# Patient Record
Sex: Female | Born: 1980
Health system: Southern US, Community
[De-identification: ages and names within clinical notes are randomized; demographics above are authoritative.]

## PROBLEM LIST (undated history)

## (undated) DIAGNOSIS — F419 Anxiety disorder, unspecified: Secondary | ICD-10-CM

## (undated) HISTORY — PX: WISDOM TOOTH EXTRACTION: SHX21

## (undated) HISTORY — DX: Anxiety disorder, unspecified: F41.9

## (undated) HISTORY — PX: TONSILLECTOMY: SUR1361

---
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
## 2002-07-29 ENCOUNTER — Encounter: Payer: Self-pay | Admitting: Family Medicine

## 2002-07-29 ENCOUNTER — Encounter: Admission: RE | Admit: 2002-07-29 | Discharge: 2002-07-29 | Payer: Self-pay | Admitting: Family Medicine

## 2002-08-13 ENCOUNTER — Other Ambulatory Visit: Admission: RE | Admit: 2002-08-13 | Discharge: 2002-08-13 | Payer: Self-pay | Admitting: Obstetrics and Gynecology

## 2003-06-22 ENCOUNTER — Other Ambulatory Visit: Admission: RE | Admit: 2003-06-22 | Discharge: 2003-06-22 | Payer: Self-pay | Admitting: Obstetrics and Gynecology

## 2004-05-12 ENCOUNTER — Inpatient Hospital Stay: Payer: Self-pay | Admitting: Obstetrics & Gynecology

## 2008-05-18 ENCOUNTER — Emergency Department (HOSPITAL_COMMUNITY): Admission: EM | Admit: 2008-05-18 | Discharge: 2008-05-18 | Payer: Self-pay | Admitting: Emergency Medicine

## 2008-10-19 ENCOUNTER — Ambulatory Visit (HOSPITAL_COMMUNITY): Admission: RE | Admit: 2008-10-19 | Discharge: 2008-10-19 | Payer: Self-pay | Admitting: Obstetrics & Gynecology

## 2009-03-22 ENCOUNTER — Emergency Department: Payer: Self-pay | Admitting: Emergency Medicine

## 2009-03-27 ENCOUNTER — Inpatient Hospital Stay (HOSPITAL_COMMUNITY): Admission: RE | Admit: 2009-03-27 | Discharge: 2009-03-29 | Payer: Self-pay | Admitting: Obstetrics & Gynecology

## 2010-09-27 LAB — CBC
HCT: 35.3 % — ABNORMAL LOW (ref 36.0–46.0)
Hemoglobin: 10.7 g/dL — ABNORMAL LOW (ref 12.0–15.0)
Hemoglobin: 11.9 g/dL — ABNORMAL LOW (ref 12.0–15.0)
MCHC: 33.5 g/dL (ref 30.0–36.0)
MCHC: 33.6 g/dL (ref 30.0–36.0)
MCV: 90.7 fL (ref 78.0–100.0)
MCV: 92.3 fL (ref 78.0–100.0)
Platelets: 272 10*3/uL (ref 150–400)
RBC: 3.45 MIL/uL — ABNORMAL LOW (ref 3.87–5.11)
RBC: 3.89 MIL/uL (ref 3.87–5.11)
RDW: 13.7 % (ref 11.5–15.5)
RDW: 13.9 % (ref 11.5–15.5)
WBC: 10.3 10*3/uL (ref 4.0–10.5)

## 2011-03-26 LAB — HCG, QUANTITATIVE, PREGNANCY: hCG, Beta Chain, Quant, S: 4521 — ABNORMAL HIGH

## 2011-03-26 LAB — URINALYSIS, ROUTINE W REFLEX MICROSCOPIC
Bilirubin Urine: NEGATIVE
Glucose, UA: NEGATIVE
Ketones, ur: NEGATIVE
Protein, ur: 30 — AB
Urobilinogen, UA: 1

## 2011-03-26 LAB — URINE MICROSCOPIC-ADD ON

## 2011-03-26 LAB — ABO/RH: ABO/RH(D): O POS

## 2011-03-26 LAB — POCT PREGNANCY, URINE: Preg Test, Ur: POSITIVE

## 2012-11-11 ENCOUNTER — Ambulatory Visit: Payer: Medicaid Other | Admitting: Obstetrics & Gynecology

## 2013-01-06 ENCOUNTER — Ambulatory Visit: Payer: Medicaid Other | Admitting: Obstetrics & Gynecology

## 2013-02-03 ENCOUNTER — Encounter: Payer: Self-pay | Admitting: Obstetrics & Gynecology

## 2013-02-03 ENCOUNTER — Ambulatory Visit (INDEPENDENT_AMBULATORY_CARE_PROVIDER_SITE_OTHER): Payer: Medicaid Other | Admitting: Obstetrics & Gynecology

## 2013-02-03 VITALS — BP 108/66 | HR 89 | Temp 98.8°F | Ht 67.0 in | Wt 156.0 lb

## 2013-02-03 DIAGNOSIS — B379 Candidiasis, unspecified: Secondary | ICD-10-CM

## 2013-02-03 DIAGNOSIS — Z0000 Encounter for general adult medical examination without abnormal findings: Secondary | ICD-10-CM

## 2013-02-03 DIAGNOSIS — B373 Candidiasis of vulva and vagina: Secondary | ICD-10-CM

## 2013-02-03 NOTE — Progress Notes (Signed)
Subjective:     Katie Carson is a 32 y.o. female here for a routine exam.  Current complaints: patient is in the office for annual exam- possible yeast infection.     Gynecologic History No LMP recorded. Patient is not currently having periods (Reason: IUD). Contraception: IUD Last Pap: 1 year. Results were: normal Last mammogram: never. Breast US for breast cyst  Obstetric History OB History  No data available     The following portions of the patient's history were reviewed and updated as appropriate: allergies, current medications, past family history, past medical history, past social history, past surgical history and problem list.  Review of Systems Pertinent items are noted in HPI.    Objective:    General appearance: alert Breasts: normal appearance, no masses or tenderness Abdomen: soft, non-tender; bowel sounds normal; no masses,  no organomegaly Pelvic: cervix normal in appearance, IUD strings present, external genitalia normal, no adnexal masses or tenderness, uterus normal size, shape, and consistency and vagina normal with white discharge    Assessment:   Normal exam   Plan:  Check wet prep by molecular probe Return in 1 yr

## 2013-02-04 LAB — WET PREP BY MOLECULAR PROBE
Candida species: NEGATIVE
Gardnerella vaginalis: POSITIVE — AB
Trichomonas vaginosis: NEGATIVE

## 2013-02-05 LAB — POCT WET PREP (WET MOUNT): Clue Cells Wet Prep Whiff POC: NEGATIVE

## 2013-02-05 NOTE — Patient Instructions (Signed)
Health Maintenance, Females A healthy lifestyle and preventative care can promote health and wellness.  Maintain regular health, dental, and eye exams.  Eat a healthy diet. Foods like vegetables, fruits, whole grains, low-fat dairy products, and lean protein foods contain the nutrients you need without too many calories. Decrease your intake of foods high in solid fats, added sugars, and salt. Get information about a proper diet from your caregiver, if necessary.  Regular physical exercise is one of the most important things you can do for your health. Most adults should get at least 150 minutes of moderate-intensity exercise (any activity that increases your heart rate and causes you to sweat) each week. In addition, most adults need muscle-strengthening exercises on 2 or more days a week.   Maintain a healthy weight. The body mass index (BMI) is a screening tool to identify possible weight problems. It provides an estimate of body fat based on height and weight. Your caregiver can help determine your BMI, and can help you achieve or maintain a healthy weight. For adults 20 years and older:  A BMI below 18.5 is considered underweight.  A BMI of 18.5 to 24.9 is normal.  A BMI of 25 to 29.9 is considered overweight.  A BMI of 30 and above is considered obese.  Maintain normal blood lipids and cholesterol by exercising and minimizing your intake of saturated fat. Eat a balanced diet with plenty of fruits and vegetables. Blood tests for lipids and cholesterol should begin at age 20 and be repeated every 5 years. If your lipid or cholesterol levels are high, you are over 50, or you are a high risk for heart disease, you may need your cholesterol levels checked more frequently.Ongoing high lipid and cholesterol levels should be treated with medicines if diet and exercise are not effective.  If you smoke, find out from your caregiver how to quit. If you do not use tobacco, do not start.  If you  are pregnant, do not drink alcohol. If you are breastfeeding, be very cautious about drinking alcohol. If you are not pregnant and choose to drink alcohol, do not exceed 1 drink per day. One drink is considered to be 12 ounces (355 mL) of beer, 5 ounces (148 mL) of wine, or 1.5 ounces (44 mL) of liquor.  Avoid use of street drugs. Do not share needles with anyone. Ask for help if you need support or instructions about stopping the use of drugs.  High blood pressure causes heart disease and increases the risk of stroke. Blood pressure should be checked at least every 1 to 2 years. Ongoing high blood pressure should be treated with medicines, if weight loss and exercise are not effective.  If you are 55 to 30 years old, ask your caregiver if you should take aspirin to prevent strokes.  Diabetes screening involves taking a blood sample to check your fasting blood sugar level. This should be done once every 3 years, after age 45, if you are within normal weight and without risk factors for diabetes. Testing should be considered at a younger age or be carried out more frequently if you are overweight and have at least 1 risk factor for diabetes.  Breast cancer screening is essential preventative care for women. You should practice "breast self-awareness." This means understanding the normal appearance and feel of your breasts and may include breast self-examination. Any changes detected, no matter how small, should be reported to a caregiver. Women in their 20s and 30s should have   a clinical breast exam (CBE) by a caregiver as part of a regular health exam every 1 to 3 years. After age 40, women should have a CBE every year. Starting at age 40, women should consider having a mammogram (breast X-ray) every year. Women who have a family history of breast cancer should talk to their caregiver about genetic screening. Women at a high risk of breast cancer should talk to their caregiver about having an MRI and a  mammogram every year.  The Pap test is a screening test for cervical cancer. Women should have a Pap test starting at age 21. Between ages 21 and 29, Pap tests should be repeated every 2 years. Beginning at age 30, you should have a Pap test every 3 years as long as the past 3 Pap tests have been normal. If you had a hysterectomy for a problem that was not cancer or a condition that could lead to cancer, then you no longer need Pap tests. If you are between ages 65 and 70, and you have had normal Pap tests going back 10 years, you no longer need Pap tests. If you have had past treatment for cervical cancer or a condition that could lead to cancer, you need Pap tests and screening for cancer for at least 20 years after your treatment. If Pap tests have been discontinued, risk factors (such as a new sexual partner) need to be reassessed to determine if screening should be resumed. Some women have medical problems that increase the chance of getting cervical cancer. In these cases, your caregiver may recommend more frequent screening and Pap tests.  The human papillomavirus (HPV) test is an additional test that may be used for cervical cancer screening. The HPV test looks for the virus that can cause the cell changes on the cervix. The cells collected during the Pap test can be tested for HPV. The HPV test could be used to screen women aged 30 years and older, and should be used in women of any age who have unclear Pap test results. After the age of 30, women should have HPV testing at the same frequency as a Pap test.  Colorectal cancer can be detected and often prevented. Most routine colorectal cancer screening begins at the age of 50 and continues through age 75. However, your caregiver may recommend screening at an earlier age if you have risk factors for colon cancer. On a yearly basis, your caregiver may provide home test kits to check for hidden blood in the stool. Use of a small camera at the end of a  tube, to directly examine the colon (sigmoidoscopy or colonoscopy), can detect the earliest forms of colorectal cancer. Talk to your caregiver about this at age 50, when routine screening begins. Direct examination of the colon should be repeated every 5 to 10 years through age 75, unless early forms of pre-cancerous polyps or small growths are found.  Hepatitis C blood testing is recommended for all people born from 1945 through 1965 and any individual with known risks for hepatitis C.  Practice safe sex. Use condoms and avoid high-risk sexual practices to reduce the spread of sexually transmitted infections (STIs). Sexually active women aged 25 and younger should be checked for Chlamydia, which is a common sexually transmitted infection. Older women with new or multiple partners should also be tested for Chlamydia. Testing for other STIs is recommended if you are sexually active and at increased risk.  Osteoporosis is a disease in which the   bones lose minerals and strength with aging. This can result in serious bone fractures. The risk of osteoporosis can be identified using a bone density scan. Women ages 12 and over and women at risk for fractures or osteoporosis should discuss screening with their caregivers. Ask your caregiver whether you should be taking a calcium supplement or vitamin D to reduce the rate of osteoporosis.  Menopause can be associated with physical symptoms and risks. Hormone replacement therapy is available to decrease symptoms and risks. You should talk to your caregiver about whether hormone replacement therapy is right for you.  Use sunscreen with a sun protection factor (SPF) of 30 or greater. Apply sunscreen liberally and repeatedly throughout the day. You should seek shade when your shadow is shorter than you. Protect yourself by wearing long sleeves, pants, a wide-brimmed hat, and sunglasses year round, whenever you are outdoors.  Notify your caregiver of new moles or  changes in moles, especially if there is a change in shape or color. Also notify your caregiver if a mole is larger than the size of a pencil eraser.  Stay current with your immunizations. Document Released: 12/24/2010 Document Revised: 09/02/2011 Document Reviewed: 12/24/2010 [facility] Patient Information 2014 [place], [place]. Vaginitis Vaginitis is an inflammation of the vagina. It is most often caused by a change in the normal balance of the bacteria and yeast that live in the vagina. This change in balance causes an overgrowth of certain bacteria or yeast, which causes the inflammation. There are different types of vaginitis, but the most common types are:  Bacterial vaginosis.  Yeast infection (candidiasis).  Trichomoniasis vaginitis. This is a sexually transmitted infection (STI).  Viral vaginitis.  Atropic vaginitis.  Allergic vaginitis. CAUSES  The cause depends on the type of vaginitis. Vaginitis can be caused by:  Bacteria (bacterial vaginosis).  Yeast (yeast infection).  A parasite (trichomoniasis vaginitis)  A virus (viral vaginitis).  Low hormone levels (atrophic vaginitis). Low hormone levels can occur during pregnancy, breastfeeding, or after menopause.  Irritants, such as bubble baths, scented tampons, and feminine sprays (allergic vaginitis). Other factors can change the normal balance of the yeast and bacteria that live in the vagina. These include:  Antibiotic medicines.  Poor hygiene.  Diaphragms, vaginal sponges, spermicides, birth control pills, and intrauterine devices (IUD).  Sexual intercourse.  Infection.  Uncontrolled diabetes.  A weakened immune system. SYMPTOMS  Symptoms can vary depending on the cause of the vaginitis. Common symptoms include:  Abnormal vaginal discharge.  The discharge is white, gray, or yellow with bacterial vaginosis.  The discharge is thick, white, and cheesy with a yeast infection.  The discharge is frothy and  yellow or greenish with trichomoniasis.  A bad vaginal odor.  The odor is fishy with bacterial vaginosis.  Vaginal itching, pain, or swelling.  Painful intercourse.  Pain or burning when urinating. Sometimes, there are no symptoms. TREATMENT  Treatment will vary depending on the type of infection.   Bacterial vaginosis and trichomoniasis are often treated with antibiotic creams or pills.  Yeast infections are often treated with antifungal medicines, such as vaginal creams or suppositories.  Viral vaginitis has no cure, but symptoms can be treated with medicines that relieve discomfort. Your sexual partner should be treated as well.  Atrophic vaginitis may be treated with an estrogen cream, pill, suppository, or vaginal ring. If vaginal dryness occurs, lubricants and moisturizing creams may help. You may be told to avoid scented soaps, sprays, or douches.  Allergic vaginitis treatment involves quitting the  use of the product that is causing the problem. Vaginal creams can be used to treat the symptoms. HOME CARE INSTRUCTIONS   Take all medicines as directed by your caregiver.  Keep your genital area clean and dry. Avoid soap and only rinse the area with water.  Avoid douching. It can remove the healthy bacteria in the vagina.  Do not use tampons or have sexual intercourse until your vaginitis has been treated. Use sanitary pads while you have vaginitis.  Wipe from front to back. This avoids the spread of bacteria from the rectum to the vagina.  Let air reach your genital area.  Wear cotton underwear to decrease moisture buildup.  Avoid wearing underwear while you sleep until your vaginitis is gone.  Avoid tight pants and underwear or nylons without a cotton panel.  Take off wet clothing (especially bathing suits) as soon as possible.  Use mild, non-scented products. Avoid using irritants, such as:  Scented feminine sprays.  Fabric softeners.  Scented  detergents.  Scented tampons.  Scented soaps or bubble baths.  Practice safe sex and use condoms. Condoms may prevent the spread of trichomoniasis and viral vaginitis. SEEK MEDICAL CARE IF:   You have abdominal pain.  You have a fever or persistent symptoms for more than 2 3 days.  You have a fever and your symptoms suddenly get worse. Document Released: 04/07/2007 Document Revised: 03/04/2012 Document Reviewed: 11/21/2011 [facility] Patient Information 2014 [place], [place].

## 2013-02-19 ENCOUNTER — Other Ambulatory Visit: Payer: Self-pay | Admitting: *Deleted

## 2013-02-19 DIAGNOSIS — B379 Candidiasis, unspecified: Secondary | ICD-10-CM

## 2013-02-19 MED ORDER — FLUCONAZOLE 150 MG PO TABS: 150.0000 mg | ORAL_TABLET | Freq: Once | ORAL | Status: DC

## 2013-02-22 ENCOUNTER — Other Ambulatory Visit: Payer: Self-pay | Admitting: Oncology

## 2013-03-08 ENCOUNTER — Encounter: Payer: Self-pay | Admitting: Obstetrics & Gynecology

## 2013-07-29 ENCOUNTER — Encounter: Payer: Self-pay | Admitting: Obstetrics & Gynecology

## 2013-07-29 ENCOUNTER — Ambulatory Visit (INDEPENDENT_AMBULATORY_CARE_PROVIDER_SITE_OTHER): Payer: Medicaid Other | Admitting: Obstetrics & Gynecology

## 2013-07-29 VITALS — BP 115/82 | HR 89 | Temp 97.6°F | Wt 164.0 lb

## 2013-07-29 DIAGNOSIS — Z30432 Encounter for removal of intrauterine contraceptive device: Secondary | ICD-10-CM

## 2013-07-29 MED ORDER — NORETHIN-ETH ESTRAD-FE BIPHAS 1 MG-10 MCG / 10 MCG PO TABS: 1.0000 | ORAL_TABLET | Freq: Every day | ORAL | Status: DC

## 2013-07-29 NOTE — Progress Notes (Signed)
Subjective:     Katie Carson is a 33 y.o. female here IUD removal  Gynecologic History No LMP recorded. Patient is not currently having periods (Reason: IUD).   Obstetric History OB History  Gravida Para Term Preterm AB SAB TAB Ectopic Multiple Living  3 2 1 1 1 1    2     # Outcome Date GA Lbr Len/2nd Weight Sex Delivery Anes PTL Lv  3 TRM 03/27/09 [id number]  8 lb (3.629 kg) M SVD EPI  Y  2 PRE 05/12/04 [id number]  6 lb 9 oz (2.977 kg) F SVD EPI  Y  1 SAB                The following portions of the patient's history were reviewed and updated as appropriate: allergies, current medications, past family history, past medical history, past social history, past surgical history and problem list.  Review of Systems Pertinent items are noted in HPI.    Objective:     Pelvic:  The IUD was removed intact     Assessment:    S/P IUD removal  Plan:   COCP Return prn

## 2014-01-03 ENCOUNTER — Ambulatory Visit: Payer: Medicaid Other | Admitting: Obstetrics & Gynecology

## 2014-03-28 ENCOUNTER — Telehealth: Payer: Self-pay | Admitting: *Deleted

## 2014-03-28 NOTE — Telephone Encounter (Signed)
Patient in call log regarding a Mirena IUD. Attempted to contact patient and left a message for patient to contact the office.

## 2014-04-11 ENCOUNTER — Ambulatory Visit: Payer: Medicaid Other | Admitting: Obstetrics & Gynecology

## 2014-04-25 ENCOUNTER — Encounter: Payer: Self-pay | Admitting: Obstetrics & Gynecology

## 2014-05-09 ENCOUNTER — Ambulatory Visit: Payer: Medicaid Other | Admitting: Obstetrics & Gynecology

## 2014-05-23 ENCOUNTER — Ambulatory Visit: Payer: Medicaid Other | Admitting: Obstetrics

## 2014-06-20 ENCOUNTER — Encounter: Payer: Self-pay | Admitting: *Deleted

## 2014-06-21 ENCOUNTER — Encounter: Payer: Self-pay | Admitting: Obstetrics & Gynecology

## 2014-08-03 ENCOUNTER — Ambulatory Visit: Payer: Medicaid Other | Admitting: Obstetrics

## 2014-08-25 ENCOUNTER — Ambulatory Visit: Payer: Medicaid Other | Admitting: Certified Nurse Midwife

## 2014-08-31 ENCOUNTER — Ambulatory Visit: Payer: Medicaid Other | Admitting: Certified Nurse Midwife

## 2014-10-28 ENCOUNTER — Ambulatory Visit: Payer: Medicaid Other | Admitting: Certified Nurse Midwife

## 2014-11-23 ENCOUNTER — Ambulatory Visit: Payer: Self-pay | Admitting: Certified Nurse Midwife

## 2014-12-07 ENCOUNTER — Ambulatory Visit: Payer: Self-pay | Admitting: Certified Nurse Midwife

## 2014-12-21 ENCOUNTER — Ambulatory Visit: Payer: Self-pay | Admitting: Certified Nurse Midwife

## 2015-01-11 ENCOUNTER — Ambulatory Visit: Payer: Medicaid Other | Admitting: Certified Nurse Midwife

## 2015-01-13 ENCOUNTER — Ambulatory Visit: Payer: Medicaid Other | Admitting: Certified Nurse Midwife

## 2015-01-26 ENCOUNTER — Ambulatory Visit: Payer: Medicaid Other | Admitting: Certified Nurse Midwife

## 2015-01-30 ENCOUNTER — Emergency Department (HOSPITAL_COMMUNITY)
Admission: EM | Admit: 2015-01-30 | Discharge: 2015-01-30 | Disposition: A | Discharge status: Home / Self Care | Payer: Medicaid Other | Source: Home / Self Care | Attending: Family Medicine | Admitting: Family Medicine

## 2015-01-30 ENCOUNTER — Encounter (HOSPITAL_COMMUNITY): Payer: Self-pay | Admitting: Emergency Medicine

## 2015-01-30 DIAGNOSIS — H10023 Other mucopurulent conjunctivitis, bilateral: Secondary | ICD-10-CM | POA: Diagnosis not present

## 2015-01-30 MED ORDER — OLOPATADINE HCL 0.2 % OP SOLN: 1.0000 [drp] | Freq: Every day | OPHTHALMIC | Status: DC

## 2015-01-30 MED ORDER — POLYMYXIN B-TRIMETHOPRIM 10000-0.1 UNIT/ML-% OP SOLN: 2.0000 [drp] | Freq: Four times a day (QID) | OPHTHALMIC | Status: DC

## 2015-01-30 NOTE — ED Provider Notes (Signed)
CSN: [id number]     Arrival date & time 01/30/15  1320 History   First MD Initiated Contact with Patient 01/30/15 1443     Chief Complaint  Patient presents with  . Eye Problem   (Consider location/radiation/quality/duration/timing/severity/associated sxs/prior Treatment) HPI  Red eyes. Started on the left and progressed to bilateral. 2 days ago. Getting worse. Associated with clear discharge. Crusted over eyes in the morning. Slight vision change when eyes become watery. Denies eye pain or purulence. No recent cold or URI symptoms. Patient works around sick homebound patients but none of which have recently had colds or pink eye. A nice neck stiffness, rash, fever, runny nose, cough, congestion, shortness breath, palpitations, headache  Past Medical History  Diagnosis Date  . Anxiety    Past Surgical History  Procedure Laterality Date  . Tonsillectomy    . Wisdom tooth extraction     Family History  Problem Relation Age of Onset  . Cancer Father   . Cancer Maternal Grandmother   . Heart disease Maternal Grandfather    History  Substance Use Topics  . Smoking status: Former Smoker    Quit date: 07/29/2008  . Smokeless tobacco: Never Used  . Alcohol Use: No   OB History    Gravida Para Term Preterm AB TAB SAB Ectopic Multiple Living   Review of Systems Per HPI with all other pertinent systems negative.   Allergies  Review of patient's allergies indicates no known allergies.  Home Medications   Prior to Admission medications   Medication Sig Start Date End Date Taking? Authorizing Provider  ALPRAZolam Prudy Feeler) 0.5 MG tablet Take 0.5 mg by mouth at bedtime as needed for sleep.    Historical Provider, MD  fluconazole (DIFLUCAN) 150 MG tablet Take 1 tablet (150 mg total) by mouth once. 02/19/13   Antionette Char, MD  levonorgestrel (MIRENA) 20 MCG/24HR IUD 1 each by Intrauterine route once. Inserted 06/29/2009- due out 2016    Historical Provider, MD   Norethindrone-Ethinyl Estradiol-Fe Biphas (LO LOESTRIN FE) 1 MG-10 MCG / 10 MCG tablet Take 1 tablet by mouth daily. 07/29/13   Antionette Char, MD  Olopatadine HCl 0.2 % SOLN Apply 1 drop to eye daily. 01/30/15   Ozella Rocks, MD  trimethoprim-polymyxin b (POLYTRIM) ophthalmic solution Place 2 drops into both eyes every 6 (six) hours. Treat for 5-7 days 01/30/15   Ozella Rocks, MD  venlafaxine [facility]) 75 MG tablet Take 75 mg by mouth 2 (two) times daily.    Historical Provider, MD   BP 115/81 mmHg  Pulse 80  Temp(Src) 97.1 F (36.2 C) (Oral)  Resp 16  SpO2 96%  LMP 01/23/2015 (Exact Date) Physical Exam Physical Exam  Constitutional: oriented to person, place, and time. appears well-developed and well-nourished. No distress.  HENT:  Head: Normocephalic and atraumatic.  Eyes: EOMI. PERRL.  Bilateral conjunctival injectio Neck: Normal range of motion.  Cardiovascular: RRR, no m/r/g, 2+ distal pulses,  Pulmonary/Chest: Effort normal and breath sounds normal. No respiratory distress.  Abdominal: Soft. Bowel sounds are normal. NonTTP, no distension.  Musculoskeletal: Normal range of motion. Non ttp, no effusion.  Neurological: alert and oriented to person, place, and time.  Skin: Skin is warm. No rash noted. non diaphoretic.  Psychiatric: normal mood and affect. behavior is normal. Judgment and thought content normal.   ED Course  Procedures (including critical care time) Labs Review Labs Reviewed - No  data to display  Imaging Review No results found.   MDM   1. Pink eye disease of both eyes    Pataday or Zaditor, Polytrim only getting worse or not improving after an additional 7-10 days.   Ozella Rocks, MD 01/30/15 [phone]

## 2015-01-30 NOTE — ED Notes (Signed)
Pt has been suffering from red, dry, itchy eyes for two days.  She states it started in the left eye, but now both eyes are affected.  She states her eyes are crusty when she wakes up in the morning.

## 2015-01-30 NOTE — Discharge Instructions (Signed)
You likely have a viral infection in your eyes. Please use the pataday for the red eyes. If that medication is expensive you can use over the counter Zaditor. Please only use the Polytrim if your condition worsens.     Conjunctivitis Conjunctivitis is commonly called "pink eye." Conjunctivitis can be caused by bacterial or viral infection, allergies, or injuries. There is usually redness of the lining of the eye, itching, discomfort, and sometimes discharge. There may be deposits of matter along the eyelids. A viral infection usually causes a watery discharge, while a bacterial infection causes a yellowish, thick discharge. Pink eye is very contagious and spreads by direct contact. You may be given antibiotic eyedrops as part of your treatment. Before using your eye medicine, remove all drainage from the eye by washing gently with warm water and cotton balls. Continue to use the medication until you have awakened 2 mornings in a row without discharge from the eye. Do not rub your eye. This increases the irritation and helps spread infection. Use separate towels from other household members. Wash your hands with soap and water before and after touching your eyes. Use cold compresses to reduce pain and sunglasses to relieve irritation from light. Do not wear contact lenses or wear eye makeup until the infection is gone. SEEK MEDICAL CARE IF:   Your symptoms are not better after 3 days of treatment.  You have increased pain or trouble seeing.  The outer eyelids become very red or swollen. Document Released: 07/18/2004 Document Revised: 09/02/2011 Document Reviewed: 06/10/2005 [facility] Patient Information 2015 [place], [place]. This information is not intended to replace advice given to you by your health care provider. Make sure you discuss any questions you have with your health care provider.

## 2015-02-14 ENCOUNTER — Ambulatory Visit: Payer: Medicaid Other | Admitting: Certified Nurse Midwife

## 2015-02-24 ENCOUNTER — Ambulatory Visit: Payer: Medicaid Other | Admitting: Certified Nurse Midwife

## 2015-03-08 ENCOUNTER — Ambulatory Visit: Payer: Medicaid Other | Admitting: Certified Nurse Midwife

## 2015-03-14 ENCOUNTER — Ambulatory Visit: Payer: Medicaid Other | Admitting: Certified Nurse Midwife

## 2015-03-29 ENCOUNTER — Ambulatory Visit: Payer: Medicaid Other | Admitting: Certified Nurse Midwife

## 2015-03-29 ENCOUNTER — Telehealth: Payer: Self-pay | Admitting: Certified Nurse Midwife

## 2015-03-30 NOTE — Telephone Encounter (Signed)
Close encounter 

## 2015-04-06 ENCOUNTER — Ambulatory Visit: Payer: Medicaid Other | Admitting: Certified Nurse Midwife

## 2015-04-12 ENCOUNTER — Ambulatory Visit: Payer: Medicaid Other | Admitting: Certified Nurse Midwife

## 2015-06-06 ENCOUNTER — Ambulatory Visit: Payer: Medicaid Other | Admitting: Certified Nurse Midwife

## 2015-07-21 ENCOUNTER — Ambulatory Visit: Payer: Medicaid Other | Admitting: Certified Nurse Midwife

## 2015-09-29 ENCOUNTER — Ambulatory Visit: Payer: Medicaid Other | Admitting: Certified Nurse Midwife

## 2015-09-29 ENCOUNTER — Telehealth: Payer: Self-pay | Admitting: *Deleted

## 2015-10-02 NOTE — Telephone Encounter (Signed)
Reopened in error

## 2015-10-04 ENCOUNTER — Ambulatory Visit: Payer: Medicaid Other | Admitting: Certified Nurse Midwife

## 2015-10-20 ENCOUNTER — Ambulatory Visit: Payer: Medicaid Other | Admitting: Certified Nurse Midwife

## 2016-03-13 ENCOUNTER — Ambulatory Visit: Payer: Medicaid Other | Admitting: Certified Nurse Midwife

## 2016-04-08 ENCOUNTER — Ambulatory Visit: Payer: Self-pay | Admitting: Certified Nurse Midwife

## 2019-08-10 DIAGNOSIS — O0990 Supervision of high risk pregnancy, unspecified, unspecified trimester: Secondary | ICD-10-CM | POA: Insufficient documentation

## 2019-08-10 DIAGNOSIS — Z3480 Encounter for supervision of other normal pregnancy, unspecified trimester: Secondary | ICD-10-CM | POA: Insufficient documentation

## 2019-08-11 ENCOUNTER — Encounter: Payer: Self-pay | Admitting: Obstetrics & Gynecology

## 2019-08-11 ENCOUNTER — Ambulatory Visit (INDEPENDENT_AMBULATORY_CARE_PROVIDER_SITE_OTHER): Payer: Medicaid Other | Admitting: Obstetrics & Gynecology

## 2019-08-11 ENCOUNTER — Other Ambulatory Visit: Payer: Self-pay

## 2019-08-11 ENCOUNTER — Other Ambulatory Visit (HOSPITAL_COMMUNITY)
Admission: RE | Admit: 2019-08-11 | Discharge: 2019-08-11 | Disposition: A | Discharge status: Home / Self Care | Payer: Medicaid Other | Source: Ambulatory Visit | Attending: Obstetrics & Gynecology | Admitting: Obstetrics & Gynecology

## 2019-08-11 DIAGNOSIS — Z3A16 16 weeks gestation of pregnancy: Secondary | ICD-10-CM | POA: Diagnosis not present

## 2019-08-11 DIAGNOSIS — Z3481 Encounter for supervision of other normal pregnancy, first trimester: Secondary | ICD-10-CM

## 2019-08-11 DIAGNOSIS — O09521 Supervision of elderly multigravida, first trimester: Secondary | ICD-10-CM

## 2019-08-11 DIAGNOSIS — Z3480 Encounter for supervision of other normal pregnancy, unspecified trimester: Secondary | ICD-10-CM

## 2019-08-11 DIAGNOSIS — O09522 Supervision of elderly multigravida, second trimester: Secondary | ICD-10-CM | POA: Diagnosis not present

## 2019-08-11 DIAGNOSIS — O09529 Supervision of elderly multigravida, unspecified trimester: Secondary | ICD-10-CM | POA: Insufficient documentation

## 2019-08-11 NOTE — Progress Notes (Signed)
Pt states she was told she has partial placenta previa- noted on u/s from 1/29.

## 2019-08-11 NOTE — Progress Notes (Signed)
  Subjective:will want BTL    Katie Carson is a [id number] [redacted]w[redacted]d being seen today for her first obstetrical visit.  Her obstetrical history is significant for advanced maternal age. Patient does intend to breast feed. Pregnancy history fully reviewed.  Patient reports no complaints.  Vitals:   08/11/19 1325  BP: 123/74  Pulse: 98  Weight: 169 lb (76.7 kg)    HISTORY: OB History  Gravida Para Term Preterm AB Living  7 2 1 1 4 2   SAB TAB Ectopic Multiple Live Births  1       2    # Outcome Date GA Lbr Len/2nd Weight Sex Delivery Anes PTL Lv  7 Current           6 Term 03/27/09 [redacted]w[redacted]d  8 lb (3.629 kg) M Vag-Spont EPI  LIV  5 Preterm 05/12/04 [redacted]w[redacted]d  6 lb 9 oz (2.977 kg) F Vag-Spont EPI  LIV  4 AB           3 AB           2 AB           1 SAB            Past Medical History:  Diagnosis Date  . Anxiety    Past Surgical History:  Procedure Laterality Date  . TONSILLECTOMY    . WISDOM TOOTH EXTRACTION     Family History  Problem Relation Age of Onset  . Cancer Father   . Cancer Maternal Grandmother   . Heart disease Maternal Grandfather      Exam    Uterus:     Pelvic Exam:    Perineum: No Hemorrhoids   Vulva: normal   Vagina:  normal mucosa   pH:     Cervix: no lesions   Adnexa: normal adnexa   Bony Pelvis: average  System: Breast:  normal appearance, no masses or tenderness   Skin: normal coloration and turgor, no rashes    Neurologic: oriented, normal mood   Extremities: normal strength, tone, and muscle mass   HEENT PERRLA, oropharynx clear, no lesions and thyroid without masses   Mouth/Teeth mucous membranes moist, pharynx normal without lesions and dental hygiene good   Neck supple and no masses   Cardiovascular: regular rate and rhythm   Respiratory:  appears well, vitals normal, no respiratory distress, acyanotic, normal RR, neck free of mass or lymphadenopathy, chest clear, no wheezing, crepitations, rhonchi, normal symmetric air entry   Abdomen: soft, non-tender; bowel sounds normal; no masses,  no organomegaly   Urinary: urethral meatus normal      Assessment:    Pregnancy: [redacted]w[redacted]d Patient Active Problem List   Diagnosis Date Noted  . Supervision of other normal pregnancy, antepartum 08/10/2019        Plan:     Initial labs drawn. Prenatal vitamins. Problem list reviewed and updated. Genetic Screening discussedrequested.  Follow up in 4 weeks. 50% of 30 min visit spent on counseling and coordination of care.     08/12/2019 08/11/2019

## 2019-08-11 NOTE — Patient Instructions (Signed)

## 2019-08-12 LAB — CERVICOVAGINAL ANCILLARY ONLY
Bacterial Vaginitis (gardnerella): POSITIVE — AB
Candida Glabrata: NEGATIVE
Candida Vaginitis: NEGATIVE
Chlamydia: NEGATIVE
Comment: NEGATIVE
Comment: NEGATIVE
Comment: NEGATIVE
Comment: NEGATIVE
Comment: NEGATIVE
Comment: NORMAL
Neisseria Gonorrhea: NEGATIVE
Trichomonas: NEGATIVE

## 2019-08-13 ENCOUNTER — Other Ambulatory Visit: Payer: Self-pay | Admitting: Obstetrics & Gynecology

## 2019-08-13 DIAGNOSIS — N760 Acute vaginitis: Secondary | ICD-10-CM

## 2019-08-13 DIAGNOSIS — B9689 Other specified bacterial agents as the cause of diseases classified elsewhere: Secondary | ICD-10-CM

## 2019-08-13 LAB — AFP, SERUM, OPEN SPINA BIFIDA
AFP MoM: 0.76
AFP Value: 24.4 ng/mL
Gest. Age on Collection Date: 16.3 weeks
Maternal Age At EDD: 37.4 yr
OSBR Risk 1 IN: 10000
Test Results:: NEGATIVE
Weight: 169 [lb_av]

## 2019-08-13 LAB — OBSTETRIC PANEL, INCLUDING HIV
Antibody Screen: NEGATIVE
Basophils Absolute: 0 10*3/uL (ref 0.0–0.2)
Basos: 0 %
EOS (ABSOLUTE): 0.1 10*3/uL (ref 0.0–0.4)
Eos: 2 %
HIV Screen 4th Generation wRfx: NONREACTIVE
Hematocrit: 36.7 % (ref 34.0–46.6)
Hemoglobin: 12.8 g/dL (ref 11.1–15.9)
Hepatitis B Surface Ag: NEGATIVE
Immature Grans (Abs): 0 10*3/uL (ref 0.0–0.1)
Immature Granulocytes: 0 %
Lymphocytes Absolute: 2.1 10*3/uL (ref 0.7–3.1)
Lymphs: 24 %
MCH: 31.3 pg (ref 26.6–33.0)
MCHC: 34.9 g/dL (ref 31.5–35.7)
MCV: 90 fL (ref 79–97)
Monocytes Absolute: 0.7 10*3/uL (ref 0.1–0.9)
Monocytes: 8 %
Neutrophils Absolute: 5.9 10*3/uL (ref 1.4–7.0)
Neutrophils: 66 %
Platelets: 361 10*3/uL (ref 150–450)
RBC: 4.09 x10E6/uL (ref 3.77–5.28)
RDW: 12.2 % (ref 11.7–15.4)
RPR Ser Ql: NONREACTIVE
Rh Factor: POSITIVE
Rubella Antibodies, IGG: 1.9 index (ref >0.99)
WBC: 8.9 10*3/uL (ref 3.4–10.8)

## 2019-08-13 LAB — CYTOLOGY - PAP
Adequacy: ABSENT
Comment: NEGATIVE
Diagnosis: NEGATIVE
High risk HPV: NEGATIVE

## 2019-08-13 LAB — CULTURE, OB URINE

## 2019-08-13 LAB — URINE CULTURE, OB REFLEX

## 2019-08-13 MED ORDER — METRONIDAZOLE 500 MG PO TABS: 500.0000 mg | ORAL_TABLET | Freq: Two times a day (BID) | ORAL | 0 refills | Status: AC

## 2019-08-19 ENCOUNTER — Telehealth: Payer: Self-pay
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
## 2019-08-20 ENCOUNTER — Other Ambulatory Visit: Payer: Medicaid Other

## 2019-08-20 ENCOUNTER — Other Ambulatory Visit: Payer: Self-pay

## 2019-08-30 ENCOUNTER — Encounter: Payer: Self-pay | Admitting: Obstetrics and Gynecology

## 2019-08-31 ENCOUNTER — Ambulatory Visit (HOSPITAL_COMMUNITY)
Admission: RE | Admit: 2019-08-31 | Discharge: 2019-08-31 | Disposition: A | Discharge status: Home / Self Care | Payer: Medicaid Other | Source: Ambulatory Visit | Attending: Obstetrics & Gynecology | Admitting: Obstetrics & Gynecology

## 2019-08-31 ENCOUNTER — Other Ambulatory Visit: Payer: Self-pay | Admitting: Obstetrics & Gynecology

## 2019-08-31 ENCOUNTER — Other Ambulatory Visit: Payer: Self-pay

## 2019-08-31 DIAGNOSIS — Z3480 Encounter for supervision of other normal pregnancy, unspecified trimester: Secondary | ICD-10-CM | POA: Diagnosis present

## 2019-08-31 DIAGNOSIS — O09522 Supervision of elderly multigravida, second trimester: Secondary | ICD-10-CM

## 2019-08-31 DIAGNOSIS — Z3A19 19 weeks gestation of pregnancy: Secondary | ICD-10-CM | POA: Diagnosis not present

## 2019-09-02 ENCOUNTER — Encounter: Payer: Self-pay | Admitting: Family Medicine

## 2019-09-02 DIAGNOSIS — O0930 Supervision of pregnancy with insufficient antenatal care, unspecified trimester: Secondary | ICD-10-CM | POA: Insufficient documentation

## 2019-09-02 DIAGNOSIS — Z3480 Encounter for supervision of other normal pregnancy, unspecified trimester: Secondary | ICD-10-CM

## 2019-09-08 ENCOUNTER — Telehealth (INDEPENDENT_AMBULATORY_CARE_PROVIDER_SITE_OTHER): Payer: Medicaid Other | Admitting: Obstetrics and Gynecology

## 2019-09-08 ENCOUNTER — Encounter: Payer: Self-pay | Admitting: Obstetrics and Gynecology

## 2019-09-08 DIAGNOSIS — O09522 Supervision of elderly multigravida, second trimester: Secondary | ICD-10-CM

## 2019-09-08 DIAGNOSIS — O0932 Supervision of pregnancy with insufficient antenatal care, second trimester: Secondary | ICD-10-CM | POA: Diagnosis not present

## 2019-09-08 DIAGNOSIS — Z3480 Encounter for supervision of other normal pregnancy, unspecified trimester: Secondary | ICD-10-CM

## 2019-09-08 DIAGNOSIS — O09529 Supervision of elderly multigravida, unspecified trimester: Secondary | ICD-10-CM

## 2019-09-08 DIAGNOSIS — O0930 Supervision of pregnancy with insufficient antenatal care, unspecified trimester: Secondary | ICD-10-CM

## 2019-09-08 MED ORDER — BLOOD PRESSURE MONITOR KIT: 1.0000 | PACK | Freq: Once | 0 refills | Status: DC

## 2019-09-08 NOTE — Progress Notes (Signed)
Pt does not yet have BP cuff- was ordered today. Pt has questions about u/s and was first told she had placenta previa- was not noted on latest u/s.

## 2019-09-08 NOTE — Progress Notes (Signed)
[place] VIRTUAL VIDEO VISIT ENCOUNTER NOTE  Provider location: [facility] at [place]   I connected with Katie Carson on 09/08/19 at  1:30 PM EDT by MyChart Video Encounter at home and verified that I am speaking with the correct person using two identifiers.   I discussed the limitations, risks, security and privacy concerns of performing an evaluation and management service virtually and the availability of in person appointments. I also discussed with the patient that there may be a patient responsible charge related to this service. The patient expressed understanding and agreed to proceed. Subjective:  Katie Carson is a 39 y.o. [phone] at 25w3dbeing seen today for ongoing prenatal care.  She is currently monitored for the following issues for this high-risk pregnancy and has Supervision of other normal pregnancy, antepartum; AMA (advanced maternal age) multigravida 35+, unspecified trimester; and Late prenatal care affecting pregnancy on their problem list.  Patient reports no complaints.  Contractions: Not present. Vag. Bleeding: None.  Movement: Present. Denies any leaking of fluid.   The following portions of the patient's history were reviewed and updated as appropriate: allergies, current medications, past family history, past medical history, past social history, past surgical history and problem list.   Objective:  There were no vitals filed for this visit.  Fetal Status:     Movement: Present     General:  Alert, oriented and cooperative. Patient is in no acute distress.  Respiratory: Normal respiratory effort, no problems with respiration noted  Mental Status: Normal mood and affect. Normal behavior. Normal judgment and thought content.  Rest of physical exam deferred due to type of encounter  Imaging: UKoreaMFM OB DETAIL +14 WK  Result Date: 08/31/2019  ----------------------------------------------------------------------  OBSTETRICS REPORT                       (Signed Final 08/31/2019 04:09 pm) ---------------------------------------------------------------------- Patient Info  ID #:       [id number]                         D.O.B.:  0Nov 03, 1982(39 yrs)  Name:       HAndy Carson            Visit Date: 08/31/2019 02:47 pm ---------------------------------------------------------------------- Performed By  Performed By:     YWilnette Kales       Ref. Address:     [address]  [place]  Attending:        Johnell Comings MD         [facility]  Referred By:      Katie Carson                    MD ---------------------------------------------------------------------- Orders   #  Description                          Code         Ordered By   1  [place] MFM OB DETAIL +14 WK              76811.01     Emeterio Reeve  ----------------------------------------------------------------------   #  Order #                    Accession #                 Episode #   1  [id number]                  [id number]                  [id number]  ---------------------------------------------------------------------- Indications   Advanced maternal age multigravida 54+,        O94.522   second trimester   Encounter for antenatal screening for          Z36.3   malformations   NIPS:Low risk, female, FF:9.6%   [redacted] weeks gestation of pregnancy                Z3A.19  ---------------------------------------------------------------------- Fetal Evaluation  Num Of Fetuses:         1  Fetal Heart Rate(bpm):  142  Cardiac Activity:       Observed  Presentation:           Cephalic   Placenta:               Posterior  P. Cord Insertion:      Visualized, central  Amniotic Fluid  AFI FV:      Within normal limits                              Largest Pocket(cm)                              5.51 ---------------------------------------------------------------------- Biometry  BPD:        43  mm     G. Age:  19w 0d         38  %    CI:        68.68   %    70 - 86  FL/HC:      17.8   %    16.1 - 18.3  HC:      165.8  mm     G. Age:  19w 2d         42  %    HC/AC:      1.16        1.09 - 1.39  AC:       143   mm     G. Age:  19w 5d         58  %    FL/BPD:     68.6   %  FL:       29.5  mm     G. Age:  19w 1d         36  %    FL/AC:      20.6   %    20 - 24  CER:        20  mm     G. Age:  19w 0d         45  %  NFT:       4.1  mm  CM:        2.9  mm  Est. FW:     288  gm    0 lb 10 oz      50  % ---------------------------------------------------------------------- OB History  Gravidity:    7         Term:   1        Prem:   1        SAB:   1  Living:       2 ---------------------------------------------------------------------- Gestational Age  LMP:           19w 2d        Date:  04/18/19                 EDD:   01/23/20  U/S Today:     19w 2d                                        EDD:   01/23/20  Best:          19w 2d     Det. By:  LMP  (04/18/19)          EDD:   01/23/20 ---------------------------------------------------------------------- Anatomy  Cranium:               Appears normal         Aortic Arch:            Appears normal  Cavum:                 Appears normal         Ductal Arch:            Appears normal  Ventricles:            Appears normal         Diaphragm:              Appears normal  Choroid Plexus:        Appears normal         Stomach:                Appears normal, left  sided  Cerebellum:            Appears normal         Abdomen:                Appears  normal  Posterior Fossa:       Appears normal         Abdominal Wall:         Appears nml (cord                                                                        insert, abd wall)  Nuchal Fold:           Appears normal         Cord Vessels:           Appears normal (3                                                                        vessel cord)  Face:                  Appears normal         Kidneys:                Appear normal                         (orbits and profile)  Lips:                  Appears normal         Bladder:                Appears normal  Thoracic:              Appears normal         Spine:                  Appears normal  Heart:                 Appears normal         Upper Extremities:      Appears normal                         (4CH, axis, and                         situs)  RVOT:                  Appears normal         Lower Extremities:      Appears normal  LVOT:                  Appears normal  Other:  Fetus appears to be a female. Open hands visualized. Heels not          visualized. Nasal bone  visualized. ---------------------------------------------------------------------- Cervix Uterus Adnexa  Cervix  Length:           3.29  cm.  Normal appearance by transabdominal scan.  Uterus  No abnormality visualized.  Left Ovary  No adnexal mass visualized.  Right Ovary  No adnexal mass visualized.  Cul De Sac  No free fluid seen.  Adnexa  No abnormality visualized. ---------------------------------------------------------------------- Comments  This patient was seen for a detailed fetal anatomy scan due  to advanced maternal age. She denies any significant past  medical history and denies any problems in her current  pregnancy.  She had a cell free DNA test earlier in her pregnancy which  indicated a low risk for trisomy 33, 63, and 13. A female fetus is  predicted.  She was informed that the fetal growth and amniotic fluid  level were appropriate for her gestational age.  There were no  obvious fetal anomalies noted on today's  ultrasound exam.  The patient was informed that anomalies may be missed due  to technical limitations. If the fetus is in a suboptimal position  or maternal habitus is increased, visualization of the fetus in  the maternal uterus may be impaired.  The increased risk of fetal aneuploidy due to advanced  maternal age was discussed. Due to advanced maternal age,  the patient was offered and declined an amniocentesis today  for definitive diagnosis of fetal aneuploidy.  Follow-up as indicated. ----------------------------------------------------------------------                   Katie Comings, MD Electronically Signed Final Report   08/31/2019 04:09 pm ----------------------------------------------------------------------   Assessment and Plan:  Pregnancy: [id number] at 43w3d1. Supervision of other normal pregnancy, antepartum Patient is doing well without complaints Results of the ultrasound reviewed with the patient - Blood Pressure Monitor KIT; 1 kit by Does not apply route once for 1 dose.  Dispense: 1 kit; Refill: 0  2. AMA (advanced maternal age) multigravida 355+ unspecified trimester Low risks screening  Preterm labor symptoms and general obstetric precautions including but not limited to vaginal bleeding, contractions, leaking of fluid and fetal movement were reviewed in detail with the patient. I discussed the assessment and treatment plan with the patient. The patient was provided an opportunity to ask questions and all were answered. The patient agreed with the plan and demonstrated an understanding of the instructions. The patient was advised to call back or seek an in-person office evaluation/[facility]. Please refer to After Visit Summary for other counseling recommendations.   I provided 11 minutes of face-to-face time during this encounter.  Return in about 4 weeks (around  10/06/2019) for Virtual, ROB, Low risk.  Future Appointments  Date Time Provider [place] 09/08/2019  1:30 PM Loletha Bertini, PVickii Chafe MD [facility] None    PMora Bellman MD [facility]

## 2019-09-10 ENCOUNTER — Encounter: Payer: Self-pay | Admitting: Obstetrics and Gynecology

## 2019-09-15 ENCOUNTER — Other Ambulatory Visit: Payer: Self-pay | Admitting: Obstetrics and Gynecology

## 2019-09-15 DIAGNOSIS — O09529 Supervision of elderly multigravida, unspecified trimester: Secondary | ICD-10-CM

## 2019-09-20 ENCOUNTER — Ambulatory Visit (HOSPITAL_COMMUNITY): Payer: Medicaid Other | Attending: Obstetrics and Gynecology | Admitting: Genetic Counselor

## 2019-09-20 ENCOUNTER — Other Ambulatory Visit: Payer: Self-pay

## 2019-09-20 DIAGNOSIS — Z148 Genetic carrier of other disease: Secondary | ICD-10-CM

## 2019-09-20 DIAGNOSIS — Z315 Encounter for genetic counseling: Secondary | ICD-10-CM | POA: Diagnosis not present

## 2019-09-20 DIAGNOSIS — Z3A22 22 weeks gestation of pregnancy: Secondary | ICD-10-CM

## 2019-09-20 NOTE — Progress Notes (Signed)
09/20/2019  Katie Carson 1981/03/01 MRN: [id number] DOV: 09/20/2019  Ms. Deist presented to the [facility] for a genetics consultation regarding her carrier status for spinal muscular atrophy (SMA). Ms. Zee came to her appointment alone due to COVID-19 visitor restrictions.   Indication for genetic counseling - Carrier for spinal muscular atrophy  Prenatal history  Ms. Oleary is a [id number], 39 y.o. female. Her current pregnancy has completed [redacted]w[redacted]d(Estimated Date of Delivery: 01/23/20).  Ms. GMonacodenied exposure to environmental toxins or chemical agents. She denied the use of alcohol or street drugs. She reported smoking one cigarette every other day. She reported taking prenatal vitamins and Tylenol. She denied significant viral illnesses, fevers, and bleeding during the course of her pregnancy. Her medical and surgical histories were noncontributory.  Prenatal exposure to tobacco can increase the risk for placenta previa and placental abruption, preterm birth, low birth weight, oral clefts, stillbirth, and sudden infant death syndrome (SIDS). Prenatal exposure to nicotine is also associated with an increased likelihood of neurological disorder and asthma. The risk of pregnancy complications associated with cigarette exposure tends to increase with the amount of cigarettes a woman smokes. It is never too late to quit smoking, and Ms. Sanborn is encouraged to continue reducing the number of cigarettes smoked per day with the goal to eventually quit smoking altogether.  Family History  A three generation pedigree was drafted and reviewed. Both family histories were reviewed and found to be noncontributory for birth defects, intellectual disability, recurrent pregnancy loss, and known genetic conditions. Ms. GHusthad limited information about her partner's extended family history; thus, risk assessment was limited.  The patient's ethnicity  is [place] The father of the pregnancy's ethnicity is African American. Ashkenazi Jewish ancestry and consanguinity were denied. Pedigree will be scanned under Media.  Discussion  Ms. Fink was referred for genetic counseling as she was identified as a carrier for spinal muscular atrophy (SMA) on Horizon-14 carrier screening. Ms. GLaguardiawas found to have 1 copy of the SMN1 gene, making her a carrier for spinal muscular atrophy (SMA). SMA is characterized by progressive muscle weakness and atrophy due to degeneration and loss of anterior horn cells (lower motor neurons) in the spinal cord and brain stem. We discussed the different types of SMA (0, I, II, and III), including differences in severity and age of onset.    Pathogenic variants in the SMN1 gene cause SMA. The number of copies of another related gene called SMN2 modifies the severity of the condition and helps determine which form of SMA an affected individual will develop. The SMN1 and SMN2 genes both provide instructions for the survival motor neuron (SMN) protein. This protein has important functions in the maintenance of motor neurons in the skeletal muscles that allow the body to move. Typically, most functional SMN protein is produced from the SMN1 gene, with a small amount produced from the SMN2 gene. Most individuals with SMA are missing a piece of the SMN1 gene, which impairs SMN protein production. A shortage of SMN protein causes progressive motor neurons death, leading to the signs and symptoms of SMA. However, the SMN protein produced by the SMN2 gene can help make up for the protein deficiency caused by pathogenic variants in SMN1. Having multiple copies of the SMN2 gene is usually associated with less severe features of SMA and later onset of symptoms.  Early diagnosis of SMA allows for early initiation of treatment options, which can reduce the amount  of lower motor neurons that are degenerated in an affected infant within the  first several months of life. Currently, there are multiple FDA-approved gene therapy treatments for SMA. In addition to these approved treatments, several other treatments are being tested in clinical trials. Due to the availability of treatments for SMA, the [place] is planning to add SMA to the state newborn screening panel beginning in April 2021.    We reviewed that SMA is inherited in an autosomal recessive pattern. This means that both parents must be carriers for the condition to have a chance of having an affected child. Based on the carrier frequency for SMA in the African American population, Ms. Vallin partner currently has a 1 in 52 chance of being a carrier for SMA. Thus, the couple currently has a 1 in 240 (0.4%) chance of having a child affected by SMA.  Ms. Arnott carrier screening was negative for the other 13 conditions screened. Thus, her risk to be a carrier for these additional conditions (listed separately in the laboratory report) has been reduced but not eliminated. This also significantly reduces her risk of having a child affected by one of these conditions. We discussed that carrier screening for SMA is recommended for Ms. Helbert's partner to refine the chances that the current fetus is affected by SMA. Ms. Chambers indicated that she is interested in pursuing partner carrier screening. If her partner were found to have 2 or more copies of the SMN1 gene on carrier screening, his chance of being a carrier is reduced but not eliminated. If both parents are carriers of SMA, there would be a 1 in 4 (25%) chance of having an affected fetus with each of the couple's pregnancies.    It is recommended that Ms. Keller inform her relatives about her SMA carrier status. If Ms. Canton's mother is also a carrier of SMA, Ms. Gail maternal half siblings would have a 50% chance of being a carrier for SMA themselves. If they were  also identified to be carriers, it would be recommended that their partners consider carrier screening for SMA either during the preconception period or during pregnancy to further refine the couples' chances of having a child affected by SMA.  We also reviewed that Ms. Lightsey had Panorama NIPS through the laboratory [facility] that was low-risk for fetal aneuploidies. We reviewed that these results showed a less than 1 in 10,000 risk for trisomies 21, 18 and 13, and monosomy X (Turner syndrome).  In addition, the risk for triploidy and sex chromosome trisomies (47,XXX and 47,XXY) was also low. Ms. Dollens elected to have cfDNA analysis for 22q11.2 deletion syndrome, which was also low risk (1 in 9000). We reviewed that while this testing identifies 94-99% of pregnancies with trisomy 11, trisomy 11, trisomy 88, sex chromosome aneuploidies, and triploidy, it is NOT diagnostic. A positive test result requires confirmation by CVS or amniocentesis, and a negative test result does not rule out a fetal chromosome abnormality. She also understands that this testing does not identify all genetic conditions.  Finally, Ms. Pohl was counseled regarding diagnostic testing via amniocentesis. We discussed the technical aspects of the procedure and quoted up to a 1 in 500 (0.2%) risk for spontaneous pregnancy loss or other adverse pregnancy outcomes as a result of amniocentesis. Cultured cells from an amniocentesis sample allow for the visualization of a fetal karyotype, which can detect >99% of chromosomal aberrations. Chromosomal microarray can also be performed to identify smaller  deletions or duplications of fetal chromosomal material. Amniocentesis could also be performed to assess for both SMN1 and SMN2 copy number in the fetus to determine whether the fetus is affected by SMA. After careful consideration, Ms. Cadotte declined amniocentesis at this time. She understands that amniocentesis is available at any  point after 16 weeks of pregnancy and that she may opt to undergo the procedure at a later date should she change her mind.   Ms. Fennimore was interested in pursuing SMA carrier screening for her partner, Marrianne Mood; however, he does not have health insurance. Mr. Wanita Chamberlain qualifies for free testing through the laboratory [facility]'s Patient Assistance Program. IprovidedMs. Gerringerwitha saliva kit and a partially completed Patient Assistance Program application form for Mr.Siler. He was waiting in the car during Ms. Crayton's appointment, so he provided a saliva sample and signed the Patient Assistance Program application immediately after our consultation today and returned it to me so I could ship the sample to [facility] via [employer]. Testingresults will take 2-3 weeks to be returned. I will call the couple when results become available.  I counseled Ms. Ridling regarding the above risks and available options. The approximate face-to-face time with the genetic counselor was 40 minutes.  In summary:  Discussed carrier screening results and options for follow-up testing  Carrier for spinal muscular atrophy  Partner provided sample for SMA carrier screening today. I will call the couple when results become available in 2-3 weeks  Reviewed low-risk NIPS result  Reduction in risk for Down syndrome,trisomy 18,trisomy 50, sex chromosome aneuploidies, and 22q11.2deletionsyndrome  Offered additional testing and screening  Declined amniocentesis  Reviewed family history concerns   Buelah Manis, MS, Counselling psychologist

## 2019-09-23 ENCOUNTER — Other Ambulatory Visit: Payer: Self-pay

## 2019-09-23 DIAGNOSIS — Z3480 Encounter for supervision of other normal pregnancy, unspecified trimester: Secondary | ICD-10-CM

## 2019-09-23 MED ORDER — PREPLUS 27-1 MG PO TABS: 1.0000 | ORAL_TABLET | Freq: Every day | ORAL | 13 refills | Status: AC

## 2019-09-23 NOTE — Progress Notes (Signed)
Prenatal rx sent to pts pharmacy per request.

## 2019-10-04 ENCOUNTER — Telehealth (HOSPITAL_COMMUNITY): Payer: Self-pay | Admitting: Genetic Counselor

## 2019-10-04 NOTE — Telephone Encounter (Signed)
Received call back from Ms. Siers to review the results from her partner's spinal muscular atrophy (SMA) carrier screening. Carrier screening was offered for Ms. Kahler's partner, Jeanene Erb, as Ms. Hildreth was identified as a carrier of SMA. Mr. Early Chars was identified to have 3 copies of the SMN1 gene. This result reduces, but does not eliminate his risk to be a carrier for SMA. Based on the sensitivity of the screen, there is a 1 in 342 residual risk for Mr. Early Chars to be a carrier for SMA. Thus, the couple has a 1 in 1368 (0.07%) chance to have a baby affected by SMA.  I asked Ms. Elias if she would like me to contact her partner by phone to inform him about these results. She indicated that she would inform him herself. I provided my contact information should Ms. Aber or her partner have any further questions about the testing or the risks to the current pregnancy. Ms. [place] confirmed that she had no further questions at this time.  Gershon Crane, MS, [facility] Genetic Counselor

## 2019-10-04 NOTE — Telephone Encounter (Signed)
LVM for Katie Carson indicating that I was calling to review the results from her partner's carrier screening with her. Requested a call back to my direct line to discuss these in more detail, as no identifiers were provided in voicemail message.   Gershon Crane, MS, [facility] Genetic Counselor

## 2019-10-06 ENCOUNTER — Encounter: Payer: Self-pay | Admitting: Obstetrics

## 2019-10-06 ENCOUNTER — Telehealth (INDEPENDENT_AMBULATORY_CARE_PROVIDER_SITE_OTHER): Payer: Medicaid Other | Admitting: Obstetrics

## 2019-10-06 DIAGNOSIS — O0933 Supervision of pregnancy with insufficient antenatal care, third trimester: Secondary | ICD-10-CM | POA: Diagnosis not present

## 2019-10-06 DIAGNOSIS — B9689 Other specified bacterial agents as the cause of diseases classified elsewhere: Secondary | ICD-10-CM | POA: Diagnosis not present

## 2019-10-06 DIAGNOSIS — N760 Acute vaginitis: Secondary | ICD-10-CM | POA: Diagnosis not present

## 2019-10-06 DIAGNOSIS — O23593 Infection of other part of genital tract in pregnancy, third trimester: Secondary | ICD-10-CM | POA: Diagnosis not present

## 2019-10-06 DIAGNOSIS — Z3480 Encounter for supervision of other normal pregnancy, unspecified trimester: Secondary | ICD-10-CM

## 2019-10-06 DIAGNOSIS — Z3A24 24 weeks gestation of pregnancy: Secondary | ICD-10-CM

## 2019-10-06 DIAGNOSIS — O0930 Supervision of pregnancy with insufficient antenatal care, unspecified trimester: Secondary | ICD-10-CM

## 2019-10-06 MED ORDER — BLOOD PRESSURE MONITOR KIT: 1.0000 | PACK | Freq: Once | 0 refills | Status: AC

## 2019-10-06 MED ORDER — TINIDAZOLE 500 MG PO TABS: 1000.0000 mg | ORAL_TABLET | Freq: Every day | ORAL | 2 refills | Status: DC

## 2019-10-06 NOTE — Progress Notes (Signed)
Pt states that she has Bacterial infection again.    Pt does not have BP cuff.

## 2019-10-06 NOTE — Progress Notes (Signed)
   OBSTETRICS PRENATAL VIRTUAL VISIT ENCOUNTER NOTE  Provider location: [facility] [place] at [place]   I connected with Katie Carson on 10/06/19 at  2:00 PM EDT by MyChart Video Encounter at home and verified that I am speaking with the correct person using two identifiers.   I discussed the limitations, risks, security and privacy concerns of performing an evaluation and management service virtually and the availability of in person appointments. I also discussed with the patient that there may be a patient responsible charge related to this service. The patient expressed understanding and agreed to proceed. Subjective:  Katie Carson is a 39 y.o. [phone] at 14w3dbeing seen today for ongoing prenatal care.  She is currently monitored for the following issues for this low-risk pregnancy and has Supervision of other normal pregnancy, antepartum; AMA (advanced maternal age) multigravida 35+, unspecified trimester; and Late prenatal care affecting pregnancy on their problem list.  Patient reports no complaints and vaginal discharge.  Contractions: Not present. Vag. Bleeding: None.  Movement: Present. Denies any leaking of fluid.   The following portions of the patient's history were reviewed and updated as appropriate: allergies, current medications, past family history, past medical history, past social history, past surgical history and problem list.   Objective:  There were no vitals filed for this visit.  Fetal Status:     Movement: Present     General:  Alert, oriented and cooperative. Patient is in no acute distress.  Respiratory: Normal respiratory effort, no problems with respiration noted  Mental Status: Normal mood and affect. Normal behavior. Normal judgment and thought content.  Rest of physical exam deferred due to type of encounter  Imaging: No results found.  Assessment and Plan:  Pregnancy: [id number] 371w3d. Supervision of other normal pregnancy,  antepartum  Rx: - Blood Pressure Monitor KIT; 1 kit by    2. Late prenatal care affecting pregnancy, antepartum   Preterm labor symptoms and general obstetric precautions including but not limited to vaginal bleeding, contractions, leaking of fluid and fetal movement were reviewed in detail with the patient. I discussed the assessment and treatment plan with the patient. The patient was provided an opportunity to ask questions and all were answered. The patient agreed with the plan and demonstrated an understanding of the instructions. The patient was advised to call back or seek an in-person office evaluation/[facility] or concerning [facility]. Please refer to After Visit Summary for other counseling recommendations.   I provided 10 minutes of face-to-face time during this encounter.  Return in about 4 weeks (around 11/03/2019) for ROB, 2 hour OGTT.  Future Appointments  Date Time Provider De[place]5/05/2020  9:00 AM [facility] None  11/03/2019 10:15 AM HoTre[facility] MallCNM [facility] None    ChBaltazar NajjarMDLenoraor [facility] 10/06/2019

## 2019-10-11 ENCOUNTER — Encounter: Payer: Self-pay | Admitting: Obstetrics and Gynecology

## 2019-10-14 ENCOUNTER — Telehealth (HOSPITAL_COMMUNITY): Payer: Self-pay | Admitting: Genetic Counselor

## 2019-10-14 NOTE — Telephone Encounter (Signed)
I received a call from Ms. Postle asking a question about her partner's carrier screening. Ms. Sian received a saliva sample kit from [facility] in the mail requesting a sample from her partner [facility] for carrier screening. [place] has already completed carrier screening through a different laboratory called [facility], which was negative. Testing was pursued through [facility] rather than [facility] because it was free of charge for him given that he does not have health insurance. I informed Ms. Schwarting that she could throw out the sample kit from [facility] as there is no need for [place] to complete testing again. She confirmed that she had no further questions at this time.  Buelah Manis, MS, [facility] Genetic Counselor

## 2019-10-27 ENCOUNTER — Other Ambulatory Visit: Payer: Self-pay | Admitting: Obstetrics

## 2019-11-01 ENCOUNTER — Inpatient Hospital Stay (HOSPITAL_COMMUNITY)
Admission: AD | Admit: 2019-11-01 | Discharge: 2019-11-11 | DRG: 788 | Disposition: A | Discharge status: Home / Self Care | Payer: Medicaid Other | Attending: Obstetrics & Gynecology | Admitting: Obstetrics & Gynecology

## 2019-11-01 ENCOUNTER — Encounter (HOSPITAL_COMMUNITY): Payer: Self-pay | Admitting: Obstetrics & Gynecology

## 2019-11-01 ENCOUNTER — Inpatient Hospital Stay (HOSPITAL_COMMUNITY): Payer: Medicaid Other

## 2019-11-01 ENCOUNTER — Other Ambulatory Visit: Payer: Self-pay

## 2019-11-01 DIAGNOSIS — O42913 Preterm premature rupture of membranes, unspecified as to length of time between rupture and onset of labor, third trimester: Principal | ICD-10-CM

## 2019-11-01 DIAGNOSIS — Z20822 Contact with and (suspected) exposure to covid-19: Secondary | ICD-10-CM | POA: Diagnosis present

## 2019-11-01 DIAGNOSIS — F17200 Nicotine dependence, unspecified, uncomplicated: Secondary | ICD-10-CM | POA: Diagnosis present

## 2019-11-01 DIAGNOSIS — O99824 Streptococcus B carrier state complicating childbirth: Secondary | ICD-10-CM | POA: Diagnosis present

## 2019-11-01 DIAGNOSIS — Z3A29 29 weeks gestation of pregnancy: Secondary | ICD-10-CM | POA: Diagnosis not present

## 2019-11-01 DIAGNOSIS — O9902 Anemia complicating childbirth: Secondary | ICD-10-CM | POA: Diagnosis present

## 2019-11-01 DIAGNOSIS — O42919 Preterm premature rupture of membranes, unspecified as to length of time between rupture and onset of labor, unspecified trimester: Secondary | ICD-10-CM

## 2019-11-01 DIAGNOSIS — O42113 Preterm premature rupture of membranes, onset of labor more than 24 hours following rupture, third trimester: Secondary | ICD-10-CM | POA: Diagnosis not present

## 2019-11-01 DIAGNOSIS — O0990 Supervision of high risk pregnancy, unspecified, unspecified trimester: Secondary | ICD-10-CM

## 2019-11-01 DIAGNOSIS — B951 Streptococcus, group B, as the cause of diseases classified elsewhere: Secondary | ICD-10-CM | POA: Diagnosis present

## 2019-11-01 DIAGNOSIS — O4290 Premature rupture of membranes, unspecified as to length of time between rupture and onset of labor, unspecified weeks of gestation: Secondary | ICD-10-CM

## 2019-11-01 DIAGNOSIS — O99334 Smoking (tobacco) complicating childbirth: Secondary | ICD-10-CM | POA: Diagnosis present

## 2019-11-01 DIAGNOSIS — Z3A28 28 weeks gestation of pregnancy: Secondary | ICD-10-CM

## 2019-11-01 DIAGNOSIS — O09523 Supervision of elderly multigravida, third trimester: Secondary | ICD-10-CM | POA: Diagnosis not present

## 2019-11-01 DIAGNOSIS — O99820 Streptococcus B carrier state complicating pregnancy: Secondary | ICD-10-CM | POA: Diagnosis not present

## 2019-11-01 DIAGNOSIS — O0993 Supervision of high risk pregnancy, unspecified, third trimester: Secondary | ICD-10-CM | POA: Diagnosis not present

## 2019-11-01 DIAGNOSIS — O09893 Supervision of other high risk pregnancies, third trimester: Secondary | ICD-10-CM | POA: Diagnosis not present

## 2019-11-01 DIAGNOSIS — Z3A37 37 weeks gestation of pregnancy: Secondary | ICD-10-CM | POA: Diagnosis not present

## 2019-11-01 LAB — CBC
HCT: 35.3 % — ABNORMAL LOW (ref 36.0–46.0)
Hemoglobin: 11.6 g/dL — ABNORMAL LOW (ref 12.0–15.0)
MCH: 30.8 pg (ref 26.0–34.0)
MCHC: 32.9 g/dL (ref 30.0–36.0)
MCV: 93.6 fL (ref 80.0–100.0)
Platelets: 319 10*3/uL (ref 150–400)
RBC: 3.77 MIL/uL — ABNORMAL LOW (ref 3.87–5.11)
RDW: 13.5 % (ref 11.5–15.5)
WBC: 10.3 10*3/uL (ref 4.0–10.5)
nRBC: 0 % (ref 0.0–0.2)

## 2019-11-01 LAB — WET PREP, GENITAL
Clue Cells Wet Prep HPF POC: NONE SEEN
Sperm: NONE SEEN
Trich, Wet Prep: NONE SEEN
Yeast Wet Prep HPF POC: NONE SEEN

## 2019-11-01 LAB — SARS CORONAVIRUS 2 BY RT PCR (HOSPITAL ORDER, PERFORMED IN ~~LOC~~ HOSPITAL LAB): SARS Coronavirus 2: NEGATIVE

## 2019-11-01 LAB — POCT FERN TEST
POCT Fern Test: POSITIVE
POCT Fern Test: POSITIVE

## 2019-11-01 LAB — TYPE AND SCREEN
ABO/RH(D): O POS
Antibody Screen: NEGATIVE

## 2019-11-01 MED ORDER — SODIUM CHLORIDE 0.9 % IV SOLN
2.0000 g | Freq: Four times a day (QID) | INTRAVENOUS | Status: AC
  Administered 2019-11-01 – 2019-11-03 (×8): 2 g via INTRAVENOUS
  Filled 2019-11-01 (×8): qty 2000

## 2019-11-01 MED ORDER — CALCIUM CARBONATE ANTACID 500 MG PO CHEW
2.0000 | CHEWABLE_TABLET | ORAL | Status: DC | PRN
  Administered 2019-11-02 – 2019-11-07 (×9): 400 mg via ORAL
  Filled 2019-11-01 (×9): qty 2

## 2019-11-01 MED ORDER — LACTATED RINGERS IV SOLN: INTRAVENOUS | Status: DC

## 2019-11-01 MED ORDER — PRENATAL MULTIVITAMIN CH
1.0000 | ORAL_TABLET | Freq: Every day | ORAL | Status: DC
  Administered 2019-11-02 – 2019-11-07 (×6): 1 via ORAL
  Filled 2019-11-01 (×6): qty 1

## 2019-11-01 MED ORDER — DOCUSATE SODIUM 100 MG PO CAPS
100.0000 mg | ORAL_CAPSULE | Freq: Every day | ORAL | Status: DC
  Filled 2019-11-01 (×3): qty 1

## 2019-11-01 MED ORDER — ZOLPIDEM TARTRATE 5 MG PO TABS
5.0000 mg | ORAL_TABLET | Freq: Every evening | ORAL | Status: DC | PRN
  Administered 2019-11-02 – 2019-11-07 (×6): 5 mg via ORAL
  Filled 2019-11-01 (×6): qty 1

## 2019-11-01 MED ORDER — MAGNESIUM SULFATE BOLUS VIA INFUSION
4.0000 g | Freq: Once | INTRAVENOUS | Status: AC
  Administered 2019-11-01: 4 g via INTRAVENOUS
  Filled 2019-11-01: qty 1000

## 2019-11-01 MED ORDER — ACETAMINOPHEN 325 MG PO TABS
650.0000 mg | ORAL_TABLET | ORAL | Status: DC | PRN
  Administered 2019-11-05: 650 mg via ORAL
  Filled 2019-11-01: qty 2

## 2019-11-01 MED ORDER — BETAMETHASONE SOD PHOS & ACET 6 (3-3) MG/ML IJ SUSP
12.0000 mg | INTRAMUSCULAR | Status: AC
  Administered 2019-11-01 – 2019-11-02 (×2): 12 mg via INTRAMUSCULAR
  Filled 2019-11-01: qty 5

## 2019-11-01 MED ORDER — AZITHROMYCIN 250 MG PO TABS
1000.0000 mg | ORAL_TABLET | Freq: Once | ORAL | Status: AC
  Administered 2019-11-01: 1000 mg via ORAL
  Filled 2019-11-01: qty 4

## 2019-11-01 MED ORDER — AMOXICILLIN 500 MG PO CAPS
500.0000 mg | ORAL_CAPSULE | Freq: Three times a day (TID) | ORAL | Status: DC
  Administered 2019-11-03 – 2019-11-07 (×13): 500 mg via ORAL
  Filled 2019-11-01 (×13): qty 1

## 2019-11-01 MED ORDER — MAGNESIUM SULFATE 40 GM/1000ML IV SOLN
2.0000 g/h | INTRAVENOUS | Status: AC
  Administered 2019-11-01 – 2019-11-02 (×2): 2 g/h via INTRAVENOUS
  Filled 2019-11-01 (×2): qty 1000

## 2019-11-01 NOTE — H&P (Signed)
Chief Complaint:  Rupture of Membranes      First Provider Initiated Contact with Patient 11/01/19 1538       HPI: Katie Carson is a 39 y.o. [id number] at [redacted]w[redacted]d who presents to maternity admissions reporting leaking of fluid. Reports gush of fluid about an hour prior to arrival. Since then clear fluid has continued to leak. Denies abdominal pain, contractions, or vaginal bleeding. Good fetal movement.      Pregnancy Course: [facility]. Late to care at ~24 wks. Hx of 36 wk SVD followed by 41 wk SVD.           Past Medical History:    Diagnosis   Date    .   Anxiety                            OB History    Gravida   Para   Term   Preterm   AB   Living    7   2   1   1   4   2     SAB   TAB   Ectopic   Multiple   Live Births    1               2         #   Outcome   Date   GA   Lbr Len/2nd   Weight   Sex   Delivery   Anes   PTL   Lv    7   Current                                        6   Term   03/27/09   [redacted]w[redacted]d       3629 g   M   Vag-Spont   EPI       LIV    5   Preterm   05/12/04   [redacted]w[redacted]d       2977 g   F   Vag-Spont   EPI       LIV    4   AB                                        3   AB                                        2   AB                                        1   SAB                                                 Past Surgical History:    Procedure   Laterality   Date    .   TONSILLECTOMY            .  WISDOM TOOTH EXTRACTION                     Family History    Problem   Relation   Age of Onset    .   Cancer   Father        .   Cancer   Maternal Grandmother        .   Heart disease   Maternal Grandfather            Social History              Tobacco Use    .   Smoking status:   Light Tobacco Smoker            Last attempt to quit:   07/29/2008            Years since quitting:   11.2    .   Smokeless tobacco:   Never Used    .   Tobacco comment: 1 a day    Substance Use Topics    .   Alcohol use:   Not Currently    .   Drug use:   No            Allergies    Allergen   Reactions    .   Latex   Rash               Medications Prior to Admission    Medication   Sig   Dispense   Refill   Last Dose    .   Prenatal Vit-Fe Fumarate-FA (PREPLUS) 27-1 MG TABS   Take 1 tablet by mouth daily.   30 tablet   13   11/01/2019 at Unknown time    .   ALPRAZolam (XANAX) 0.5 MG tablet   Take 0.5 mg by mouth at bedtime as needed for sleep.                Marland Kitchen   Olopatadine HCl 0.2 % SOLN   Apply 1 drop to eye daily. (Patient not taking: Reported on 08/11/2019)   1 Bottle   0        .   tinidazole (TINDAMAX) 500 MG tablet   Take 2 tablets (1,000 mg total) by mouth daily with breakfast.   10 tablet   2              I have reviewed patient's Past Medical Hx, Surgical Hx, Family Hx, Social Hx, medications and allergies.      ROS:   Review of Systems   Constitutional: Negative.    Gastrointestinal: Negative.    Genitourinary: Positive for vaginal discharge. Negative for vaginal bleeding.          Physical Exam       Patient Vitals for the past 24 hrs:       BP   Temp   Temp src   Pulse   Resp   Height   Weight    11/01/19 1508   116/63   97.7 F (36.5 C)   Oral   92   18   5\' 7"  (1.702 m)   79.8 kg          Constitutional: Well-developed, well-nourished female in no acute distress.   Cardiovascular: normal rate & rhythm, no murmur  Respiratory: normal effort, lung sounds clear throughout  GI: Abd soft, non-tender, gravid appropriate for gestational age. Pos BS x  4  MS: Extremities nontender, no edema, normal ROM  Neurologic: Alert and oriented x 4.   GU:   Moderate amount of clear fluid leaking from introitus. Digital exam deferred.      NST:  Baseline: 145 bpm, Variability: Good {> 6 bpm), Accelerations: Non-reactive but appropriate for gestational age and Decelerations: Variable: mild     Labs:  Lab Results Last 24 Hours                                                                                          Imaging:   No results found.     MAU Course:      Orders Placed This Encounter    Procedures    .   Fern Test    .   POCT fern test       No orders of the defined types were placed in this encounter.        MDM:  Fetal tracing appropriate for gestational age. No contractions on TOCO. Patient denies any abdominal pain & is grossly rupture - will defer digital or pelvic exam. BSUS confirms vertex presentation at this time.      Dr. Debroah Loop notified of patient & will place admission orders     Dr. Algernon Huxley (neo) notified of admission           Pt informed that the ultrasound is considered a limited OB ultrasound and is not intended to be a complete ultrasound exam.  Patient also informed that the ultrasound is not being completed with the intent of assessing for fetal or placental anomalies or any pelvic abnormalities.  Explained that the purpose of today's ultrasound is to assess for  presentation.  Patient acknowledges the purpose of the exam and the limitations of the study.  cephalic        Assessment:   1.   Preterm premature rupture of membranes (PPROM) with unknown onset of labor     2.   [redacted] weeks gestation of pregnancy           Plan:  Admit to [facility] unit         Judeth Horn, NP  11/01/2019  3:52 PM             Electronically signed by Judeth Horn, NP at 11/01/2019  3:58 PM  Attestation of  Attending Supervision of Advanced Practitioner (CNM/NP/PA): Evaluation and management procedures were performed by the Advanced Practitioner under my supervision and collaboration. I have reviewed the Advanced Practitioner's note and chart, and I agree with the management and plan.  Scheryl Darter MD

## 2019-11-01 NOTE — MAU Provider Note (Signed)
Chief Complaint:  Rupture of Membranes   First Provider Initiated Contact with Patient 11/01/19 1538     HPI: Katie Carson is a 39 y.o. [id number] at [redacted]w[redacted]d who presents to maternity admissions reporting leaking of fluid. Reports gush of fluid about an hour prior to arrival. Since then clear fluid has continued to leak. Denies abdominal pain, contractions, or vaginal bleeding. Good fetal movement.   Pregnancy Course: [facility]. Late to care at ~24 wks. Hx of 36 wk SVD followed by 41 wk SVD.   Past Medical History:  Diagnosis Date  . Anxiety    OB History  Gravida Para Term Preterm AB Living  7 2 1 1 4 2   SAB TAB Ectopic Multiple Live Births  1       2    # Outcome Date GA Lbr Len/2nd Weight Sex Delivery Anes PTL Lv  7 Current           6 Term 03/27/09 [redacted]w[redacted]d  3629 g M Vag-Spont EPI  LIV  5 Preterm 05/12/04 [redacted]w[redacted]d  2977 g F Vag-Spont EPI  LIV  4 AB           3 AB           2 AB           1 SAB            Past Surgical History:  Procedure Laterality Date  . TONSILLECTOMY    . WISDOM TOOTH EXTRACTION     Family History  Problem Relation Age of Onset  . Cancer Father   . Cancer Maternal Grandmother   . Heart disease Maternal Grandfather    Social History   Tobacco Use  . Smoking status: Light Tobacco Smoker    Last attempt to quit: 07/29/2008    Years since quitting: 11.2  . Smokeless tobacco: Never Used  . Tobacco comment: 1 a day  Substance Use Topics  . Alcohol use: Not Currently  . Drug use: No   Allergies  Allergen Reactions  . Latex Rash   Medications Prior to Admission  Medication Sig Dispense Refill Last Dose  . Prenatal Vit-Fe Fumarate-FA (PREPLUS) 27-1 MG TABS Take 1 tablet by mouth daily. 30 tablet 13 11/01/2019 at Unknown time  . ALPRAZolam (XANAX) 0.5 MG tablet Take 0.5 mg by mouth at bedtime as needed for sleep.     01/01/2020 Olopatadine HCl 0.2 % SOLN Apply 1 drop to eye daily. (Patient not taking: Reported on 08/11/2019) 1 Bottle 0   . tinidazole (TINDAMAX)  500 MG tablet Take 2 tablets (1,000 mg total) by mouth daily with breakfast. 10 tablet 2     I have reviewed patient's Past Medical Hx, Surgical Hx, Family Hx, Social Hx, medications and allergies.   ROS:  Review of Systems  Constitutional: Negative.   Gastrointestinal: Negative.   Genitourinary: Positive for vaginal discharge. Negative for vaginal bleeding.    Physical Exam   Patient Vitals for the past 24 hrs:  BP Temp Temp src Pulse Resp Height Weight  11/01/19 1508 116/63 97.7 F (36.5 C) Oral 92 18 5\' 7"  (1.702 m) 79.8 kg    Constitutional: Well-developed, well-nourished female in no acute distress.  Cardiovascular: normal rate & rhythm, no murmur Respiratory: normal effort, lung sounds clear throughout GI: Abd soft, non-tender, gravid appropriate for gestational age. Pos BS x 4 MS: Extremities nontender, no edema, normal ROM Neurologic: Alert and oriented x 4.  GU:   Moderate amount of clear fluid  leaking from introitus. Digital exam deferred.   NST:  Baseline: 145 bpm, Variability: Good {> 6 bpm), Accelerations: Non-reactive but appropriate for gestational age and Decelerations: Variable: mild   Labs: Results for orders placed or performed during the hospital encounter of 11/01/19 (from the past 24 hour(s))  Fern Test     Status: None   Collection Time: 11/01/19  3:32 PM  Result Value Ref Range   POCT Fern Test Positive = ruptured amniotic membanes   POCT fern test     Status: Abnormal   Collection Time: 11/01/19  3:52 PM  Result Value Ref Range   POCT Fern Test Positive = ruptured amniotic membanes     Imaging:  No results found.  MAU Course: Orders Placed This Encounter  Procedures  . Fern Test  . POCT fern test   No orders of the defined types were placed in this encounter.   MDM: Fetal tracing appropriate for gestational age. No contractions on TOCO. Patient denies any abdominal pain & is grossly rupture - will defer digital or pelvic exam. BSUS  confirms vertex presentation at this time.   Dr. Roselie Awkward notified of patient & will place admission orders  Dr. Higinio Roger (neo) notified of admission    Pt informed that the ultrasound is considered a limited OB ultrasound and is not intended to be a complete ultrasound exam.  Patient also informed that the ultrasound is not being completed with the intent of assessing for fetal or placental anomalies or any pelvic abnormalities.  Explained that the purpose of today's ultrasound is to assess for  presentation.  Patient acknowledges the purpose of the exam and the limitations of the study.  cephalic   Assessment: 1. Preterm premature rupture of membranes (PPROM) with unknown onset of labor   2. [redacted] weeks gestation of pregnancy     Plan: Admit to [facility] unit    Jorje Guild, NP 11/01/2019 3:52 PM

## 2019-11-01 NOTE — MAU Note (Signed)
Pt presents to MAU with c/o PROM. She felt large gush of clear fluid today around 1415. Pt denies VB and pain. +FM

## 2019-11-02 LAB — GC/CHLAMYDIA PROBE AMP (~~LOC~~) NOT AT ARMC
Chlamydia: NEGATIVE
Comment: NEGATIVE
Comment: NORMAL
Neisseria Gonorrhea: NEGATIVE

## 2019-11-02 NOTE — Progress Notes (Signed)
Patient ID: Katie Carson, female   DOB: 1980-08-17, 39 y.o.   MRN: [id number] [name] is a 39 y.o. [id number] at [redacted]w[redacted]d by early ultrasound who is admitted for PROM.   Fetal presentation is cephalic. Length of Stay:  1  Days  Subjective:  Patient reports the fetal movement as active. Patient reports uterine contraction  activity as none. Patient reports  vaginal bleeding as none. Patient describes fluid per vagina as Clear.  Vitals:  Blood pressure (!) 107/52, pulse 88, temperature 97.6 F (36.4 C), temperature source Oral, resp. rate 16, height 5\' 7"  (1.702 m), weight 81.2 kg, last menstrual period 04/18/2019, SpO2 100 %. Physical Examination:  General appearance - alert, well appearing, and in no distress Heart - normal rate and regular rhythm Abdomen - soft, nontender, nondistended Fundal Height:  size equals dates Cervical Exam: Not evaluated Extremities: extremities normal, atraumatic, no cyanosis or edema and Homans sign is negative, no sign of DVT Membranes:ruptured, clear fluid  Fetal Monitoring:   Fetal Heart Rate A  Mode External filed at 11/02/2019 1030  Baseline Rate (A) 125 bpm filed at 11/02/2019 1030  Variability 6-25 BPM filed at 11/02/2019 1030  Accelerations 15 x 15 filed at 11/02/2019 1030  Decelerations None filed at 11/02/2019 1030     Labs:  Results for orders placed or performed during the hospital encounter of 11/01/19 (from the past 24 hour(s))  Fern Test   Collection Time: 11/01/19  3:32 PM  Result Value Ref Range   POCT Fern Test Positive = ruptured amniotic membanes   POCT fern test   Collection Time: 11/01/19  3:52 PM  Result Value Ref Range   POCT Fern Test Positive = ruptured amniotic membanes   SARS Coronavirus 2 by RT PCR (hospital order, performed in [place] hospital lab) Nasopharyngeal Nasopharyngeal Swab   Collection Time: 11/01/19  4:01 PM   Specimen: Nasopharyngeal  Swab  Result Value Ref Range   SARS Coronavirus 2 NEGATIVE NEGATIVE  Type and screen [place]   Collection Time: 11/01/19  4:05 PM  Result Value Ref Range   ABO/RH(D) O POS    Antibody Screen NEG    Sample Expiration      11/04/2019,2359 Performed at [facility], [place] [address] [place], [place] [address]   Wet prep, genital   Collection Time: 11/01/19  4:31 PM   Specimen: Vaginal/Rectal  Result Value Ref Range   Yeast Wet Prep HPF POC NONE SEEN NONE SEEN   Trich, Wet Prep NONE SEEN NONE SEEN   Clue Cells Wet Prep HPF POC NONE SEEN NONE SEEN   WBC, Wet Prep HPF POC MANY (A) NONE SEEN   Sperm NONE SEEN   CBC   Collection Time: 11/01/19  4:31 PM  Result Value Ref Range   WBC 10.3 4.0 - 10.5 K/uL   RBC 3.77 (L) 3.87 - 5.11 MIL/uL   Hemoglobin 11.6 (L) 12.0 - 15.0 g/dL   HCT 35.3 (L) 36.0 - 46.0 %   MCV 93.6 80.0 - 100.0 fL   MCH 30.8 26.0 - 34.0 pg   MCHC 32.9 30.0 - 36.0 g/dL   RDW 13.5 11.5 - 15.5 %   Platelets 319 150 - 400 K/uL   nRBC 0.0 0.0 - 0.2 %     Medications:  Scheduled . [START ON 11/03/2019] amoxicillin  500 mg Oral TID  . betamethasone acetate-betamethasone sodium phosphate  12 mg Intramuscular Q24H  .  docusate sodium  100 mg Oral Daily  . prenatal multivitamin  1 tablet Oral Q1200   I have reviewed the patient's current medications.  ASSESSMENT: Patient Active Problem List   Diagnosis Date Noted  . Premature rupture of membranes 11/01/2019  . Late prenatal care affecting pregnancy 09/02/2019  . AMA (advanced maternal age) multigravida 35+, unspecified trimester 08/11/2019  . Supervision of other normal pregnancy, antepartum 08/10/2019    PLAN: Latency antibiotics and magnesium as ordered.   Scheryl Darter 11/02/2019,11:41 AM

## 2019-11-03 ENCOUNTER — Other Ambulatory Visit: Payer: Medicaid Other

## 2019-11-03 ENCOUNTER — Encounter: Payer: Medicaid Other | Admitting: Advanced Practice Midwife

## 2019-11-03 DIAGNOSIS — Z3A37 37 weeks gestation of pregnancy: Secondary | ICD-10-CM

## 2019-11-03 LAB — CULTURE, BETA STREP (GROUP B ONLY)

## 2019-11-03 NOTE — Progress Notes (Signed)
Patient ID: Katie Carson, female   DOB: 1980/08/11, 39 y.o.   MRN: [id number]  FACULTY PRACTICE ANTEPARTUM(COMPREHENSIVE) NOTE  Katie Carson is a 39 y.o. [id number] at [redacted]w[redacted]d by LMP who is admitted for PROM.   Fetal presentation is cephalic. Length of Stay:  2  Days  Subjective:  Patient reports the fetal movement as active. Patient reports uterine contraction  activity as none. Patient reports  vaginal bleeding as none. Patient describes fluid per vagina as None.  Vitals:  Blood pressure 114/75, pulse 96, temperature 97.8 F (36.6 C), temperature source Oral, resp. rate 18, height 5\' 7"  (1.702 m), weight 81.2 kg, last menstrual period 04/18/2019, SpO2 95 %. Physical Examination:  General appearance - alert, well appearing, and in no distress Heart - normal rate and regular rhythm Abdomen - soft, nontender, nondistended Fundal Height:  size equals dates  Cervical Exam: Not evaluated.  Extremities: extremities normal, atraumatic, no cyanosis or edema and Homans sign is negative, no sign of DVT  Membranes:ruptured  Fetal Monitoring:   Fetal Heart Rate A  Mode External filed at 11/03/2019 0705  Baseline Rate (A) 135 bpm filed at 11/03/2019 0705  Variability 6-25 BPM, <5 BPM filed at 11/03/2019 0705  Accelerations 15 x 15 filed at 11/03/2019 01/03/2020  Decelerations None filed at 11/03/2019 0705     Labs:  No results found for this or any previous visit (from the past 24 hour(s)).   Medications:  Scheduled . amoxicillin  500 mg Oral TID  . docusate sodium  100 mg Oral Daily  . prenatal multivitamin  1 tablet Oral Q1200   I have reviewed the patient's current medications.  ASSESSMENT: Patient Active Problem List   Diagnosis Date Noted  . Premature rupture of membranes 11/01/2019  . Late prenatal care affecting pregnancy 09/02/2019  . AMA (advanced maternal age) multigravida 35+, unspecified trimester 08/11/2019  . Supervision of other normal pregnancy, antepartum  08/10/2019    PLAN: Continue to monitor intermittently. Watch for sx of PTL or infection. Latency antibiotics  08/12/2019 11/03/2019,11:15 AM

## 2019-11-04 ENCOUNTER — Encounter: Payer: Self-pay | Admitting: Student

## 2019-11-04 DIAGNOSIS — B951 Streptococcus, group B, as the cause of diseases classified elsewhere: Secondary | ICD-10-CM | POA: Insufficient documentation

## 2019-11-04 LAB — CBC
HCT: 33.5 % — ABNORMAL LOW (ref 36.0–46.0)
Hemoglobin: 11.2 g/dL — ABNORMAL LOW (ref 12.0–15.0)
MCH: 31.2 pg (ref 26.0–34.0)
MCHC: 33.4 g/dL (ref 30.0–36.0)
MCV: 93.3 fL (ref 80.0–100.0)
Platelets: 376 10*3/uL (ref 150–400)
RBC: 3.59 MIL/uL — ABNORMAL LOW (ref 3.87–5.11)
RDW: 13.3 % (ref 11.5–15.5)
WBC: 16.4 10*3/uL — ABNORMAL HIGH (ref 4.0–10.5)
nRBC: 0 % (ref 0.0–0.2)

## 2019-11-04 LAB — TYPE AND SCREEN
ABO/RH(D): O POS
Antibody Screen: NEGATIVE

## 2019-11-04 NOTE — Progress Notes (Signed)
Pt called out for this RN, stating that she had a change in her vaginal discharge. Moderate amount of watery, pink-tinged bloody show noted on pad. Pt denies pain or cramping at this time. Notified Dr. Debroah Loop. No new orders received at this time. Will continue to monitor.

## 2019-11-04 NOTE — Progress Notes (Signed)
Patient ID: Katie Carson, female   DOB: 10-14-1980, 39 y.o.   MRN: [id number] FACULTY PRACTICE ANTEPARTUM(COMPREHENSIVE) NOTE  Katie Carson is a 39 y.o. [id number] at [redacted]w[redacted]d by early ultrasound who is admitted for PROM.   Fetal presentation is cephalic. Length of Stay:  3  Days  Subjective:  Patient reports the fetal movement as active. Patient reports uterine contraction  activity as none. Patient reports  vaginal bleeding as none. Patient describes fluid per vagina as Clear.  Vitals:  Blood pressure 106/66, pulse 87, temperature 98.5 F (36.9 C), temperature source Oral, resp. rate 18, height 5\' 7"  (1.702 m), weight 81.2 kg, last menstrual period 04/18/2019, SpO2 99 %. Physical Examination:  General appearance - alert, well appearing, and in no distress Heart - normal rate and regular rhythm Abdomen - soft, nontender, nondistended Fundal Height:  size equals dates Cervical Exam: Not evaluated. Extremities: extremities normal, atraumatic, no cyanosis or edema and Homans sign is negative Membranes:intact  Fetal Monitoring:   Fetal Heart Rate A  Mode External  [removed] filed at 11/03/2019 2129  Baseline Rate (A) 145 bpm filed at 11/03/2019 2129  Variability 6-25 BPM filed at 11/03/2019 2129  Accelerations 10 x 10, 15 x 15 filed at 11/03/2019 2129  Decelerations None filed at 11/03/2019 2129     Labs:  No results found for this or any previous visit (from the past 24 hour(s)).   Medications:  Scheduled . amoxicillin  500 mg Oral TID  . docusate sodium  100 mg Oral Daily  . prenatal multivitamin  1 tablet Oral Q1200   I have reviewed the patient's current medications.  ASSESSMENT: Patient Active Problem List   Diagnosis Date Noted  . Premature rupture of membranes 11/01/2019  . Late prenatal care affecting pregnancy 09/02/2019  . AMA (advanced maternal age) multigravida 35+, unspecified trimester 08/11/2019  . Supervision of other normal pregnancy, antepartum  08/10/2019    PLAN: Monitor intermittently. Observe for PTL or infection  08/12/2019 11/04/2019,10:00 AM

## 2019-11-05 NOTE — Progress Notes (Signed)
Patient ID: Katie Carson, female   DOB: [birth year], 39 y.o.   MRN: [id number] FACULTY PRACTICE ANTEPARTUM(COMPREHENSIVE) NOTE  Katie Carson is a 39 y.o. [id number] at [redacted]w[redacted]d by early ultrasound who is admitted for PROM.   Fetal presentation is cephalic. Length of Stay:  4  Days  Subjective: Slight spotting has resolved Patient reports the fetal movement as active. Patient reports uterine contraction  activity as none. Patient reports  vaginal bleeding as spotting. Patient describes fluid per vagina as Clear.  Vitals:  Blood pressure 114/63, pulse 94, temperature 97.9 F (36.6 C), temperature source Oral, resp. rate 18, height 5\' 7"  (1.702 m), weight 81.2 kg, last menstrual period 04/18/2019, SpO2 95 %. Physical Examination:  General appearance - alert, well appearing, and in no distress Heart - normal rate and regular rhythm Abdomen - soft, nontender, nondistended Fundal Height:  size equals dates Extremities: extremities normal, atraumatic, no cyanosis or edema and Homans sign is negative, no sign of DVT Membranes:ruptured, clear fluid  Fetal Monitoring:  Fetal Heart Rate A  Mode External filed at 11/04/2019 2152  Baseline Rate (A) 140 bpm filed at 11/04/2019 2152  Variability 6-25 BPM filed at 11/04/2019 2152  Accelerations 15 x 15 filed at 11/04/2019 2152  Decelerations None filed at 11/04/2019 2152     Labs:  Results for orders placed or performed during the hospital encounter of 11/01/19 (from the past 24 hour(s))  CBC   Collection Time: 11/04/19  4:21 PM  Result Value Ref Range   WBC 16.4 (H) 4.0 - 10.5 K/uL   RBC 3.59 (L) 3.87 - 5.11 MIL/uL   Hemoglobin 11.2 (L) 12.0 - 15.0 g/dL   HCT 11/06/19 (L) 07.6 - 22.6 %   MCV 93.3 80.0 - 100.0 fL   MCH 31.2 26.0 - 34.0 pg   MCHC 33.4 30.0 - 36.0 g/dL   RDW 33.3 54.5 - 62.5 %   Platelets 376 150 - 400 K/uL   nRBC 0.0 0.0 - 0.2 %  Type and screen [facility]   Collection Time: 11/04/19  4:21 PM   Result Value Ref Range   ABO/RH(D) O POS    Antibody Screen NEG    Sample Expiration      11/07/2019,2359 Performed at [facility], [address], [place], [place]      Medications:  Scheduled . amoxicillin  500 mg Oral TID  . docusate sodium  100 mg Oral Daily  . prenatal multivitamin  1 tablet Oral Q1200   I have reviewed the patient's current medications.  ASSESSMENT: Patient Active Problem List   Diagnosis Date Noted  . Positive GBS test 11/04/2019  . Premature rupture of membranes 11/01/2019  . Late prenatal care affecting pregnancy 09/02/2019  . AMA (advanced maternal age) multigravida 35+, unspecified trimester 08/11/2019  . Supervision of other normal pregnancy, antepartum 08/10/2019    PLAN: Continue present management for PPROM  08/12/2019 11/05/2019,9:58 AM

## 2019-11-06 NOTE — Progress Notes (Signed)
Patient ID: Katie Carson, female   DOB: 07-18-1980, 39 y.o.   MRN: [id number] FACULTY PRACTICE ANTEPARTUM(COMPREHENSIVE) NOTE  Katie Carson is a 39 y.o. [id number] at [redacted]w[redacted]d by best clinical estimate who is admitted for PROM.   Fetal presentation is cephalic. Length of Stay:  5  Days  ASSESSMENT: Active Problems:   Premature rupture of membranes   PLAN: Continue latency antibiotics - no evidence of infection Delivery with worsening maternal or fetal status Reassuring FHR  Subjective: Feels well. Still leaking, clear fluid. Patient reports the fetal movement as active. Patient reports uterine contraction  activity as none. Patient reports  vaginal bleeding as none. Patient describes fluid per vagina as Clear.  Vitals:  Blood pressure 107/64, pulse 99, temperature 98.1 F (36.7 C), temperature source Oral, resp. rate 19, height 5\' 7"  (1.702 m), weight 81.2 kg, last menstrual period 04/18/2019, SpO2 99 %. Physical Examination:  General appearance - alert, well appearing, and in no distress Chest - normal effort Abdomen - gravid, non-tender Fundal Height:  size equals dates Extremities: Homans sign is negative, no sign of DVT  Membranes:ruptured, clear fluid  Fetal Monitoring:  Baseline: 145 bpm, Variability: Good {> 6 bpm), Accelerations: Reactive and Decelerations: Absent   Medications:  Scheduled . amoxicillin  500 mg Oral TID  . docusate sodium  100 mg Oral Daily  . prenatal multivitamin  1 tablet Oral Q1200   I have reviewed the patient's current medications.   04/20/2019, MD 11/06/2019,7:22 AM

## 2019-11-07 DIAGNOSIS — O99820 Streptococcus B carrier state complicating pregnancy: Secondary | ICD-10-CM

## 2019-11-07 DIAGNOSIS — Z3A29 29 weeks gestation of pregnancy: Secondary | ICD-10-CM

## 2019-11-07 DIAGNOSIS — O09893 Supervision of other high risk pregnancies, third trimester: Secondary | ICD-10-CM

## 2019-11-07 LAB — CBC
HCT: 33 % — ABNORMAL LOW (ref 36.0–46.0)
Hemoglobin: 10.9 g/dL — ABNORMAL LOW (ref 12.0–15.0)
MCH: 30.5 pg (ref 26.0–34.0)
MCHC: 33 g/dL (ref 30.0–36.0)
MCV: 92.4 fL (ref 80.0–100.0)
Platelets: 335 10*3/uL (ref 150–400)
RBC: 3.57 MIL/uL — ABNORMAL LOW (ref 3.87–5.11)
RDW: 13.2 % (ref 11.5–15.5)
WBC: 12.6 10*3/uL — ABNORMAL HIGH (ref 4.0–10.5)
nRBC: 0 % (ref 0.0–0.2)

## 2019-11-07 LAB — TYPE AND SCREEN
ABO/RH(D): O POS
Antibody Screen: NEGATIVE

## 2019-11-07 NOTE — Progress Notes (Signed)
Faculty Note  S: In to meet patient and do bedside US. No complaints.   O: BP 115/75 (BP Location: Left Arm)   Pulse 97   Temp (!) 97.5 F (36.4 C) (Oral)   Resp 17   Ht 5\' 7"  (1.702 m)   Wt 81.2 kg   LMP 04/18/2019   SpO2 99%   BMI 28.04 kg/m   Gen: alert, oriented SVE: deferred  Bedside 04/20/2019: cephalic, cardiac activity noted  A/P: Pt is 39 y.o. 30 @ [redacted]w[redacted]d who is admitted for PPROM. Cephalic by bedside [redacted]w[redacted]d. Reviewed with patient that if she goes into labor and baby is not head down, she would need a c-section, she verbalizes understanding and is in agreement with plan.   [place], M.D. Attending [facility] [employer])

## 2019-11-07 NOTE — Progress Notes (Addendum)
FACULTY PRACTICE ANTEPARTUM(COMPREHENSIVE) NOTE  Katie Carson is a 39 y.o. [phone] at 4570w0d by best clinical estimate who is admitted for PROM.   Fetal presentation is transverse, oblique on 5/10 ultrasound. Length of Stay:  6  Days  Subjective: Feels well. Still leaking clear fluid. No other complaints. Patient reports the fetal movement as active. Patient reports uterine contraction  activity as none. Patient reports  vaginal bleeding as none. Patient describes fluid per vagina as Clear.  Vitals:  Blood pressure 120/71, pulse 97, temperature 97.9 F (36.6 C), temperature source Oral, resp. rate 18, height 5\' 7"  (1.702 m), weight 81.2 kg, last menstrual period 04/18/2019, SpO2 98 %. Physical Examination:  General appearance - alert, well appearing, and in no distress Chest - normal effort Abdomen - gravid, non-tender Fundal Height:  size equals dates Extremities: Homans sign is negative, no sign of DVT  Membranes:ruptured, clear fluid  Fetal Monitoring:  Baseline: 145 bpm, Variability: Good {> 6 bpm), Accelerations: Reactive and Decelerations: Absent x 2>>both reactive.   Medications:  Scheduled . amoxicillin  500 mg Oral TID  . docusate sodium  100 mg Oral Daily  . prenatal multivitamin  1 tablet Oral Q1200   I have reviewed the patient's current medications.  Labs and Imaging: CBC Latest Ref Rng & Units 11/04/2019 11/01/2019 08/11/2019  WBC 4.0 - 10.5 K/uL 16.4(H) 10.3 8.9  Hemoglobin 12.0 - 15.0 g/dL 11.2(L) 11.6(L) 12.8  Hematocrit 36.0 - 46.0 % 33.5(L) 35.3(L) 36.7  Platelets 150 - 400 K/uL 376 319 361   US MFM OB FOLLOW UP  Result Date: 11/02/2019 ----------------------------------------------------------------------  OBSTETRICS REPORT                       (Signed Final 11/02/2019 11:14 am) ---------------------------------------------------------------------- Patient Info  ID #:       [id number]                          D.O.B.:  [birth year] (39 yrs)  Name:        Katie Carson             Visit Date: 11/01/2019 05:55 pm ---------------------------------------------------------------------- Performed By  Attending:        Ma RingsVictor Fang MD         Ref. Address:     [address]                                                             [address]                                                             [place], [place]                                                             [phone]  Performed By:  Mesha Tester BS,       Secondary Phy.:   [facility]                    RDMS, RVT                                                             Care  Referred By:      Adam Phenix         [facility] ---------------------------------------------------------------------- Orders  #  Description                           Code        Ordered By  1  [place] MFM OB FOLLOW UP                   98119.14    Scheryl Darter ----------------------------------------------------------------------  #  Order #                     Accession #                Episode #  1  [id number]                   [id number]                 [id number] ---------------------------------------------------------------------- Indications  Premature rupture of membranes - leaking       O42.90  fluid  [redacted] weeks gestation of pregnancy                Z3A.19  Advanced maternal age multigravida 54+,        O77.523  third trimester  NIPS:Low risk, female, FF:9.6%  Encounter for other antenatal screening        Z36.2  follow-up  Genetic carrier (SMA)                          Z14.8  Late to prenatal care, third trimester         O09.33  Poor obstetric history: Previous preterm       O09.219  delivery, antepartum ---------------------------------------------------------------------- Fetal Evaluation  Num Of Fetuses:         1  Fetal Heart Rate(bpm):  152  Cardiac Activity:       Observed  Presentation:           Transverse Oblique,  Head mat LUQ  Placenta:               Posterior  P. Cord Insertion:      Previously Visualized  Amniotic Fluid  AFI FV:      Oligohydramnios  AFI Sum(cm)     %Tile       Largest Pocket(cm)  3.9             <  3         1.7  RUQ(cm)       RLQ(cm)       LUQ(cm)        LLQ(cm)  1.6           1.7           0              0.6  Comment:    Largest pocket midline 2.9 cm. ---------------------------------------------------------------------- Biometry  BPD:      71.7  mm     G. Age:  28w 5d         59  %    CI:        77.46   %    70 - 86                                                          FL/HC:      20.2   %    18.8 - 20.6  HC:      257.9  mm     G. Age:  28w 0d         17  %    HC/AC:      1.02        1.05 - 1.21  AC:      252.9  mm     G. Age:  29w 3d         35  %    FL/BPD:     72.7   %    71 - 87  FL:       52.1  mm     G. Age:  27w 5d         25  %    FL/AC:      20.6   %    20 - 24  CER:      33.1  mm     G. Age:  29w 0d         61  %  LV:        4.3  mm  Est. FW:    1275  gm    2 lb 13 oz      60  % ---------------------------------------------------------------------- OB History  Gravidity:    7         Term:   1        Prem:   1        SAB:   1  Living:       2 ---------------------------------------------------------------------- Gestational Age  LMP:           28w 1d        Date:  04/18/19                 EDD:   01/23/20  U/S Today:     28w 3d                                        EDD:   01/21/20  Best:          28w 1d     Det. By:  LMP  (04/18/19)  EDD:   01/23/20 ---------------------------------------------------------------------- Anatomy  Cranium:               Previously seen        Aortic Arch:            Previously seen  Cavum:                 Previously seen        Ductal Arch:            Previously seen  Ventricles:            Appears normal         Diaphragm:              Previously seen  Choroid Plexus:        Previously seen        Stomach:                Appears normal, left                                                                         sided  Cerebellum:            Appears normal         Abdomen:                Previously seen  Posterior Fossa:       Previously seen        Abdominal Wall:         Previously seen  Nuchal Fold:           Previously seen        Cord Vessels:           Appears normal (3                                                                        vessel cord)  Face:                  Orbits and profile     Kidneys:                Appear normal                         previously seen  Lips:                  Previously seen        Bladder:                Appears normal  Thoracic:              Previously seen        Spine:                  Previously seen  Heart:                 Previously seen  Upper Extremities:      Previously seen  RVOT:                  Previously seen        Lower Extremities:      Previously seen  LVOT:                  Previously seen  Other:  Female gender previously seen. Open hands visualized previously.          Heels not vis. prev.  Nasal bone visualized prev. Technically difficult          due to fetal position and low amniotic fluid. ---------------------------------------------------------------------- Cervix Uterus Adnexa  Cervix  Not visualized (advanced GA >24wks)  Uterus  No abnormality visualized.  Right Ovary  Within normal limits. No adnexal mass visualized.  Left Ovary  Within normal limits. No adnexal mass visualized.  Cul De Sac  No free fluid seen.  Adnexa  No abnormality visualized. ---------------------------------------------------------------------- Comments  This patient was hospitalized due to PPROM.  The fetal growth appears appropriate for her gestational age.  Oligohydramnios with a total AFI of 3.9 cm was noted today.  A greater than 2 cm pocket of amniotic fluid was present.  Due to PPROM, she will require inpatient management until  delivery.  ----------------------------------------------------------------------                   Ma Rings, MD Electronically Signed Final Report   11/02/2019 11:14 am ----------------------------------------------------------------------  ASSESSMENT: Principal Problem:   Preterm premature rupture of membranes (PPROM) in third trimester, antepartum Active Problems:   Supervision of high-risk pregnancy   Positive GBS test   PLAN: Continue latency antibiotics - no evidence of infection Delivery with worsening maternal or fetal status Category I FHR  Routine antenatal care   Jaynie Collins, MD 11/07/2019,7:15 AM

## 2019-11-08 ENCOUNTER — Inpatient Hospital Stay (HOSPITAL_COMMUNITY): Payer: Medicaid Other | Admitting: Anesthesiology

## 2019-11-08 ENCOUNTER — Encounter (HOSPITAL_COMMUNITY)
Admission: AD | Disposition: A | Discharge status: Home / Self Care | Payer: Self-pay | Source: Home / Self Care | Attending: Obstetrics & Gynecology

## 2019-11-08 ENCOUNTER — Encounter (HOSPITAL_COMMUNITY): Payer: Self-pay | Admitting: Obstetrics & Gynecology

## 2019-11-08 DIAGNOSIS — O42113 Preterm premature rupture of membranes, onset of labor more than 24 hours following rupture, third trimester: Secondary | ICD-10-CM

## 2019-11-08 DIAGNOSIS — Z3A28 28 weeks gestation of pregnancy: Secondary | ICD-10-CM

## 2019-11-08 DIAGNOSIS — O0993 Supervision of high risk pregnancy, unspecified, third trimester: Secondary | ICD-10-CM

## 2019-11-08 DIAGNOSIS — O09523 Supervision of elderly multigravida, third trimester: Secondary | ICD-10-CM

## 2019-11-08 LAB — CBC
HCT: 30.6 % — ABNORMAL LOW (ref 36.0–46.0)
Hemoglobin: 10.1 g/dL — ABNORMAL LOW (ref 12.0–15.0)
MCH: 30.8 pg (ref 26.0–34.0)
MCHC: 33 g/dL (ref 30.0–36.0)
MCV: 93.3 fL (ref 80.0–100.0)
Platelets: 409 10*3/uL — ABNORMAL HIGH (ref 150–400)
RBC: 3.28 MIL/uL — ABNORMAL LOW (ref 3.87–5.11)
RDW: 13.1 % (ref 11.5–15.5)
WBC: 15.6 10*3/uL — ABNORMAL HIGH (ref 4.0–10.5)
nRBC: 0 % (ref 0.0–0.2)

## 2019-11-08 LAB — CREATININE, SERUM
Creatinine, Ser: 0.81 mg/dL (ref 0.44–1.00)
GFR calc Af Amer: >60 mL/min (ref >60)
GFR calc non Af Amer: >60 mL/min (ref >60)

## 2019-11-08 SURGERY — Surgical Case
Anesthesia: General | Wound class: Clean Contaminated

## 2019-11-08 MED ORDER — OXYCODONE HCL 5 MG/5ML PO SOLN: 5.0000 mg | Freq: Once | ORAL | Status: DC | PRN

## 2019-11-08 MED ORDER — DIPHENHYDRAMINE HCL 25 MG PO CAPS: 25.0000 mg | ORAL_CAPSULE | Freq: Four times a day (QID) | ORAL | Status: DC | PRN

## 2019-11-08 MED ORDER — KETOROLAC TROMETHAMINE 30 MG/ML IJ SOLN
INTRAMUSCULAR | Status: AC
  Filled 2019-11-08: qty 1

## 2019-11-08 MED ORDER — PROMETHAZINE HCL 25 MG/ML IJ SOLN: 6.2500 mg | INTRAMUSCULAR | Status: DC | PRN

## 2019-11-08 MED ORDER — HYDROMORPHONE HCL 1 MG/ML IJ SOLN
INTRAMUSCULAR | Status: AC
  Filled 2019-11-08: qty 0.5

## 2019-11-08 MED ORDER — ENOXAPARIN SODIUM 40 MG/0.4ML ~~LOC~~ SOLN
40.0000 mg | SUBCUTANEOUS | Status: DC
  Administered 2019-11-09 – 2019-11-11 (×3): 40 mg via SUBCUTANEOUS
  Filled 2019-11-08 (×3): qty 0.4

## 2019-11-08 MED ORDER — KETOROLAC TROMETHAMINE 30 MG/ML IJ SOLN
30.0000 mg | Freq: Once | INTRAMUSCULAR | Status: AC | PRN
  Administered 2019-11-08: 30 mg via INTRAVENOUS

## 2019-11-08 MED ORDER — LIDOCAINE 2% (20 MG/ML) 5 ML SYRINGE
INTRAMUSCULAR | Status: AC
  Filled 2019-11-08: qty 5

## 2019-11-08 MED ORDER — SIMETHICONE 80 MG PO CHEW
80.0000 mg | CHEWABLE_TABLET | ORAL | Status: DC
  Administered 2019-11-08 – 2019-11-10 (×3): 80 mg via ORAL
  Filled 2019-11-08 (×3): qty 1

## 2019-11-08 MED ORDER — ACETAMINOPHEN 10 MG/ML IV SOLN
1000.0000 mg | Freq: Once | INTRAVENOUS | Status: AC
  Administered 2019-11-08: 1000 mg via INTRAVENOUS

## 2019-11-08 MED ORDER — DEXAMETHASONE SODIUM PHOSPHATE 10 MG/ML IJ SOLN
INTRAMUSCULAR | Status: AC
  Filled 2019-11-08: qty 1

## 2019-11-08 MED ORDER — CEFAZOLIN SODIUM-DEXTROSE 2-4 GM/100ML-% IV SOLN
INTRAVENOUS | Status: AC
  Filled 2019-11-08: qty 100

## 2019-11-08 MED ORDER — SODIUM CHLORIDE 0.9 % IR SOLN
Status: DC | PRN
  Administered 2019-11-08: 1 via INTRAVESICAL

## 2019-11-08 MED ORDER — MIDAZOLAM HCL 2 MG/2ML IJ SOLN
INTRAMUSCULAR | Status: AC
  Filled 2019-11-08: qty 2

## 2019-11-08 MED ORDER — SUCCINYLCHOLINE CHLORIDE 200 MG/10ML IV SOSY
PREFILLED_SYRINGE | INTRAVENOUS | Status: AC
  Filled 2019-11-08: qty 10

## 2019-11-08 MED ORDER — EPHEDRINE 5 MG/ML INJ
INTRAVENOUS | Status: AC
  Filled 2019-11-08: qty 10

## 2019-11-08 MED ORDER — DEXAMETHASONE SODIUM PHOSPHATE 10 MG/ML IJ SOLN
INTRAMUSCULAR | Status: DC | PRN
  Administered 2019-11-08: 10 mg via INTRAVENOUS

## 2019-11-08 MED ORDER — CEFAZOLIN SODIUM-DEXTROSE 2-4 GM/100ML-% IV SOLN
2.0000 g | INTRAVENOUS | Status: AC
  Administered 2019-11-08: 2 g via INTRAVENOUS

## 2019-11-08 MED ORDER — OXYCODONE HCL 5 MG PO TABS: 5.0000 mg | ORAL_TABLET | Freq: Once | ORAL | Status: DC | PRN

## 2019-11-08 MED ORDER — ONDANSETRON HCL 4 MG/2ML IJ SOLN
INTRAMUSCULAR | Status: DC | PRN
  Administered 2019-11-08: 4 mg via INTRAVENOUS

## 2019-11-08 MED ORDER — ACETAMINOPHEN 325 MG PO TABS
650.0000 mg | ORAL_TABLET | Freq: Four times a day (QID) | ORAL | Status: DC | PRN
  Administered 2019-11-08 – 2019-11-09 (×4): 650 mg via ORAL
  Filled 2019-11-08 (×5): qty 2

## 2019-11-08 MED ORDER — LACTATED RINGERS IV SOLN: INTRAVENOUS | Status: DC | PRN

## 2019-11-08 MED ORDER — KETOROLAC TROMETHAMINE 30 MG/ML IJ SOLN
30.0000 mg | Freq: Four times a day (QID) | INTRAMUSCULAR | Status: AC
  Administered 2019-11-08 – 2019-11-09 (×3): 30 mg via INTRAVENOUS
  Filled 2019-11-08 (×4): qty 1

## 2019-11-08 MED ORDER — HYDROMORPHONE HCL 1 MG/ML IJ SOLN
0.2500 mg | INTRAMUSCULAR | Status: DC | PRN
  Administered 2019-11-08: 0.5 mg via INTRAVENOUS

## 2019-11-08 MED ORDER — SIMETHICONE 80 MG PO CHEW: 80.0000 mg | CHEWABLE_TABLET | ORAL | Status: DC | PRN

## 2019-11-08 MED ORDER — SUCCINYLCHOLINE CHLORIDE 200 MG/10ML IV SOSY
PREFILLED_SYRINGE | INTRAVENOUS | Status: DC | PRN
  Administered 2019-11-08: 100 mg via INTRAVENOUS

## 2019-11-08 MED ORDER — ROCURONIUM BROMIDE 10 MG/ML (PF) SYRINGE
PREFILLED_SYRINGE | INTRAVENOUS | Status: AC
  Filled 2019-11-08: qty 10

## 2019-11-08 MED ORDER — ACETAMINOPHEN 10 MG/ML IV SOLN
INTRAVENOUS | Status: AC
  Filled 2019-11-08: qty 100

## 2019-11-08 MED ORDER — PHENYLEPHRINE 40 MCG/ML (10ML) SYRINGE FOR IV PUSH (FOR BLOOD PRESSURE SUPPORT)
PREFILLED_SYRINGE | INTRAVENOUS | Status: AC
  Filled 2019-11-08: qty 10

## 2019-11-08 MED ORDER — GLYCOPYRROLATE PF 0.2 MG/ML IJ SOSY
PREFILLED_SYRINGE | INTRAMUSCULAR | Status: AC
  Filled 2019-11-08: qty 1

## 2019-11-08 MED ORDER — OXYTOCIN 40 UNITS IN NORMAL SALINE INFUSION - SIMPLE MED
INTRAVENOUS | Status: DC | PRN
  Administered 2019-11-08: 40 [IU] via INTRAVENOUS

## 2019-11-08 MED ORDER — OXYTOCIN 40 UNITS IN NORMAL SALINE INFUSION - SIMPLE MED: 2.5000 [IU]/h | INTRAVENOUS | Status: AC

## 2019-11-08 MED ORDER — NEOSTIGMINE METHYLSULFATE 3 MG/3ML IV SOSY
PREFILLED_SYRINGE | INTRAVENOUS | Status: AC
  Filled 2019-11-08: qty 3

## 2019-11-08 MED ORDER — SODIUM CHLORIDE (PF) 0.9 % IJ SOLN
INTRAMUSCULAR | Status: AC
  Filled 2019-11-08: qty 10

## 2019-11-08 MED ORDER — MENTHOL 3 MG MT LOZG: 1.0000 | LOZENGE | OROMUCOSAL | Status: DC | PRN

## 2019-11-08 MED ORDER — ZOLPIDEM TARTRATE 5 MG PO TABS: 5.0000 mg | ORAL_TABLET | Freq: Every evening | ORAL | Status: DC | PRN

## 2019-11-08 MED ORDER — FENTANYL CITRATE (PF) 250 MCG/5ML IJ SOLN
INTRAMUSCULAR | Status: AC
  Filled 2019-11-08: qty 5

## 2019-11-08 MED ORDER — SODIUM CHLORIDE 0.9 % IV SOLN: INTRAVENOUS | Status: DC | PRN

## 2019-11-08 MED ORDER — WITCH HAZEL-GLYCERIN EX PADS: 1.0000 "application " | MEDICATED_PAD | CUTANEOUS | Status: DC | PRN

## 2019-11-08 MED ORDER — SODIUM CHLORIDE 0.9 % IV SOLN: 500.0000 mg | INTRAVENOUS | Status: DC

## 2019-11-08 MED ORDER — FENTANYL CITRATE (PF) 250 MCG/5ML IJ SOLN
INTRAMUSCULAR | Status: DC | PRN
  Administered 2019-11-08: 100 ug via INTRAVENOUS
  Administered 2019-11-08: 150 ug via INTRAVENOUS
  Administered 2019-11-08 (×2): 100 ug via INTRAVENOUS

## 2019-11-08 MED ORDER — OXYCODONE HCL 5 MG PO TABS
5.0000 mg | ORAL_TABLET | ORAL | Status: DC | PRN
  Administered 2019-11-08 – 2019-11-09 (×6): 10 mg via ORAL
  Administered 2019-11-10 (×2): 5 mg via ORAL
  Administered 2019-11-10: 10 mg via ORAL
  Administered 2019-11-10: 5 mg via ORAL
  Administered 2019-11-10: 10 mg via ORAL
  Administered 2019-11-11 (×3): 5 mg via ORAL
  Filled 2019-11-08: qty 2
  Filled 2019-11-08 (×2): qty 1
  Filled 2019-11-08: qty 2
  Filled 2019-11-08: qty 1
  Filled 2019-11-08: qty 2
  Filled 2019-11-08: qty 1
  Filled 2019-11-08 (×2): qty 2
  Filled 2019-11-08: qty 1
  Filled 2019-11-08 (×3): qty 2
  Filled 2019-11-08: qty 1

## 2019-11-08 MED ORDER — IBUPROFEN 800 MG PO TABS
800.0000 mg | ORAL_TABLET | Freq: Four times a day (QID) | ORAL | Status: DC
  Administered 2019-11-09 – 2019-11-11 (×9): 800 mg via ORAL
  Filled 2019-11-08 (×9): qty 1

## 2019-11-08 MED ORDER — SENNOSIDES-DOCUSATE SODIUM 8.6-50 MG PO TABS
2.0000 | ORAL_TABLET | ORAL | Status: DC
  Administered 2019-11-08 – 2019-11-10 (×3): 2 via ORAL
  Filled 2019-11-08 (×3): qty 2

## 2019-11-08 MED ORDER — TETANUS-DIPHTH-ACELL PERTUSSIS 5-2.5-18.5 LF-MCG/0.5 IM SUSP
0.5000 mL | Freq: Once | INTRAMUSCULAR | Status: AC
  Administered 2019-11-11: 0.5 mL via INTRAMUSCULAR
  Filled 2019-11-08: qty 0.5

## 2019-11-08 MED ORDER — DIBUCAINE (PERIANAL) 1 % EX OINT: 1.0000 "application " | TOPICAL_OINTMENT | CUTANEOUS | Status: DC | PRN

## 2019-11-08 MED ORDER — PRENATAL MULTIVITAMIN CH
1.0000 | ORAL_TABLET | Freq: Every day | ORAL | Status: DC
  Administered 2019-11-09 – 2019-11-11 (×3): 1 via ORAL
  Filled 2019-11-08 (×3): qty 1

## 2019-11-08 MED ORDER — ONDANSETRON HCL 4 MG/2ML IJ SOLN
INTRAMUSCULAR | Status: AC
  Filled 2019-11-08: qty 2

## 2019-11-08 MED ORDER — PROPOFOL 10 MG/ML IV BOLUS
INTRAVENOUS | Status: AC
  Filled 2019-11-08: qty 20

## 2019-11-08 MED ORDER — LACTATED RINGERS IV SOLN: INTRAVENOUS | Status: DC

## 2019-11-08 MED ORDER — SIMETHICONE 80 MG PO CHEW
80.0000 mg | CHEWABLE_TABLET | Freq: Three times a day (TID) | ORAL | Status: DC
  Administered 2019-11-08 – 2019-11-11 (×8): 80 mg via ORAL
  Filled 2019-11-08 (×8): qty 1

## 2019-11-08 MED ORDER — PROPOFOL 10 MG/ML IV BOLUS
INTRAVENOUS | Status: DC | PRN
  Administered 2019-11-08: 200 mg via INTRAVENOUS

## 2019-11-08 MED ORDER — COCONUT OIL OIL: 1.0000 "application " | TOPICAL_OIL | Status: DC | PRN

## 2019-11-08 MED ORDER — MIDAZOLAM HCL 5 MG/5ML IJ SOLN
INTRAMUSCULAR | Status: DC | PRN
  Administered 2019-11-08: 2 mg via INTRAVENOUS

## 2019-11-08 MED ORDER — ARTIFICIAL TEARS OPHTHALMIC OINT
TOPICAL_OINTMENT | OPHTHALMIC | Status: AC
  Filled 2019-11-08: qty 3.5

## 2019-11-08 SURGICAL SUPPLY — 27 items
CHLORAPREP W/TINT 26ML (MISCELLANEOUS) ×3 IMPLANT
CLAMP CORD UMBIL (MISCELLANEOUS) IMPLANT
DRSG OPSITE POSTOP 4X10 (GAUZE/BANDAGES/DRESSINGS) ×3 IMPLANT
ELECT REM PT RETURN 9FT ADLT (ELECTROSURGICAL) ×3
ELECTRODE REM PT RTRN 9FT ADLT (ELECTROSURGICAL) ×1 IMPLANT
EXTRACTOR VACUUM M CUP 4 TUBE (SUCTIONS) IMPLANT
EXTRACTOR VACUUM M CUP 4' TUBE (SUCTIONS)
GLOVE BIOGEL PI IND STRL 6.5 (GLOVE) ×1 IMPLANT
GLOVE BIOGEL PI IND STRL 7.0 (GLOVE) ×1 IMPLANT
GLOVE BIOGEL PI INDICATOR 6.5 (GLOVE) ×2
GLOVE BIOGEL PI INDICATOR 7.0 (GLOVE) ×2
GLOVE SURG SS PI 6.5 STRL IVOR (GLOVE) ×3 IMPLANT
GOWN STRL REUS W/TWL LRG LVL3 (GOWN DISPOSABLE) ×6 IMPLANT
KIT ABG SYR 3ML LUER SLIP (SYRINGE) IMPLANT
NEEDLE HYPO 25X5/8 SAFETYGLIDE (NEEDLE) IMPLANT
NS IRRIG 1000ML POUR BTL (IV SOLUTION) ×3 IMPLANT
PACK C SECTION WH (CUSTOM PROCEDURE TRAY) ×3 IMPLANT
PAD OB MATERNITY 4.3X12.25 (PERSONAL CARE ITEMS) ×3 IMPLANT
PENCIL SMOKE EVAC W/HOLSTER (ELECTROSURGICAL) ×3 IMPLANT
RETAINER VISCERAL (MISCELLANEOUS) ×3 IMPLANT
RTRCTR C-SECT PINK 25CM LRG (MISCELLANEOUS) IMPLANT
SUT PLAIN 0 NONE (SUTURE) IMPLANT
SUT VIC AB 0 CT1 36 (SUTURE) ×12 IMPLANT
SUT VIC AB 4-0 KS 27 (SUTURE) ×3 IMPLANT
TOWEL OR 17X24 6PK STRL BLUE (TOWEL DISPOSABLE) ×3 IMPLANT
TRAY FOLEY W/BAG SLVR 14FR LF (SET/KITS/TRAYS/PACK) ×3 IMPLANT
WATER STERILE IRR 1000ML POUR (IV SOLUTION) ×3 IMPLANT

## 2019-11-08 NOTE — Op Note (Signed)
Katie Carson PROCEDURE DATE: 11/01/2019 - 11/08/2019  PREOPERATIVE DIAGNOSIS: Intrauterine pregnancy at  [redacted]w[redacted]d weeks gestation; PPROM with cord prolapse  POSTOPERATIVE DIAGNOSIS: The same  PROCEDURE:     Cesarean Section  SURGEON:  Dr. Catalina Antigua  ASSISTANT: Dr. Jerilynn Birkenhead  INDICATIONS: LELER BRION is a 39 y.o. [id number] at [redacted]w[redacted]d scheduled for cesarean section secondary to PPROM with cord prolapse.  Patient up to the bathroom to have a bowel movement and reported that "something came out". Diagnosis of cord prolapse made upon removal of underwear. My sterile gloved hand was introduced into the vagina to relieved pressure of vertex head on cord. STAT c-section called to OR staff. The risks of cesarean section discussed with the patient included but were not limited to: bleeding which may require transfusion or reoperation; infection which may require antibiotics; injury to bowel, bladder, ureters or other surrounding organs; injury to the fetus; need for additional procedures including hysterectomy in the event of a life-threatening hemorrhage; placental abnormalities wth subsequent pregnancies, incisional problems, thromboembolic phenomenon and other postoperative/anesthesia complications. The patient concurred with the proposed plan, giving informed written consent for the procedure.    FINDINGS:  Viable female infant in cephalic presentation.  Apgars 0 and 8.  Clear amniotic fluid.  Intact placenta, three vessel cord.  Normal uterus, fallopian tubes and ovaries bilaterally.  ANESTHESIA:    General INTRAVENOUS FLUIDS:2500 ml ESTIMATED BLOOD LOSS: 942 ml URINE OUTPUT:  Foley placed post procedure SPECIMENS: Placenta sent to pathology COMPLICATIONS: None immediate  PROCEDURE IN DETAIL:  The patient received intravenous antibiotics and had sequential compression devices applied to her lower extremities while in the preoperative area.  She was then taken to the operating room  where anesthesia was induced and was found to be adequate.  After an adequate timeout was performed, a Pfannenstiel skin incision was made with scalpel and carried through to the underlying layer of fascia. The fascia was incised in the midline and this incision was extended bilaterally using the Mayo scissors. Kocher clamps were applied to the superior aspect of the fascial incision and the underlying rectus muscles were dissected off bluntly. A similar process was carried out on the inferior aspect of the facial incision. The rectus muscles were separated in the midline bluntly and the peritoneum was entered bluntly. The Alexis self-retaining retractor was introduced into the abdominal cavity. Attention was turned to the lower uterine segment where a transverse hysterotomy was made with a scalpel and extended bilaterally bluntly. The infant was successfully delivered, and cord was clamped and cut and infant was handed over to awaiting neonatology team. Uterine massage was then administered and the placenta delivered intact with three-vessel cord. The uterus was cleared of clot and debris.  The hysterotomy was closed with 0 Vicryl in a running locked fashion, and an imbricating layer was also placed with a 0 Vicryl. Overall, excellent hemostasis was noted. The pelvis copiously irrigated and cleared of all clot and debris. Hemostasis was confirmed on all surfaces.  The peritoneum and the muscles were reapproximated using 0 vicryl interrupted stitches. The fascia was then closed using 0 Vicryl in a running fashion.  The skin was closed in a subcuticular fashion using 3.0 Vicryl. The patient tolerated the procedure well. Sponge, lap, instrument and needle counts were correct x 2. She was taken to the recovery room in stable condition.    Yaasir Menken ConstantMD  11/08/2019 10:02 AM

## 2019-11-08 NOTE — Progress Notes (Signed)
FACULTY PRACTICE ANTEPARTUM PROGRESS NOTE  Katie Carson is a 39 y.o. [phone] at [redacted]w[redacted]d who is admitted for PROM.  Estimated Date of Delivery: 01/23/20 Fetal presentation is cephalic.  Length of Stay:  7 Days. Admitted 11/01/2019  Subjective:  Patient reports normal fetal movement.  She denies uterine contractions, denies bleeding and leaking of fluid per vagina.  Vitals:  Blood pressure 123/80, pulse (!) 106, temperature 97.6 F (36.4 C), temperature source Oral, resp. rate 18, height 5\' 7"  (1.702 m), weight 81.2 kg, last menstrual period 04/18/2019, SpO2 100 %. Physical Examination: CONSTITUTIONAL: Well-developed, well-nourished female in no acute distress.  HENT:  Normocephalic, atraumatic, External right and left ear normal. Oropharynx is clear and moist EYES: Conjunctivae and EOM are normal. Pupils are equal, round, and reactive to light. No scleral icterus.  NECK: Normal range of motion, supple, no masses. SKIN: Skin is warm and dry. No rash noted. Not diaphoretic. No erythema. No pallor. NEUROLGIC: Alert and oriented to person, place, and time. Normal reflexes, muscle tone coordination. No cranial nerve deficit noted. PSYCHIATRIC: Normal mood and affect. Normal behavior. Normal judgment and thought content. CARDIOVASCULAR: Normal heart rate noted RESPIRATORY: Effort normal, no problems with respiration noted MUSCULOSKELETAL: Normal range of motion. No edema and no tenderness. ABDOMEN: Soft, nontender, nondistended, gravid. CERVIX: deferred  Fetal monitoring: FHR: 140 bpm, Variability: moderate, Accelerations: Present, Decelerations: Absent  Uterine activity: no contractions per hour  Results for orders placed or performed during the hospital encounter of 11/01/19 (from the past 48 hour(s))  CBC     Status: Abnormal   Collection Time: 11/07/19  4:53 PM  Result Value Ref Range   WBC 12.6 (H) 4.0 - 10.5 K/uL   RBC 3.57 (L) 3.87 - 5.11 MIL/uL   Hemoglobin 10.9 (L) 12.0 - 15.0  g/dL   HCT 11/09/19 (L) 84.1 - 32.4 %   MCV 92.4 80.0 - 100.0 fL   MCH 30.5 26.0 - 34.0 pg   MCHC 33.0 30.0 - 36.0 g/dL   RDW 40.1 02.7 - 25.3 %   Platelets 335 150 - 400 K/uL   nRBC 0.0 0.0 - 0.2 %    Comment: Performed at [facility], [address], [place], [place]  Type and screen [facility]     Status: None   Collection Time: 11/07/19  4:53 PM  Result Value Ref Range   ABO/RH(D) O POS    Antibody Screen NEG    Sample Expiration      11/10/2019,2359 Performed at [facility], [address], [place], [place]     I have reviewed the patient's current medications.  ASSESSMENT: Principal Problem:   Preterm premature rupture of membranes (PPROM) in third trimester, antepartum Active Problems:   Supervision of high-risk pregnancy   Positive GBS test   PLAN: In house until delivery S/p BTMZ 5/10-11 Cont latency abx GTT this week   Continue routine antenatal care.   06-05-2003, M.D. Attending [facility] ([facility]ulty Practice)  11/08/2019 7:33 AM

## 2019-11-08 NOTE — Discharge Summary (Signed)
Postpartum Discharge Summary      Patient Name: Katie Carson DOB: [birth year] MRN: [id number]  Date of admission: 11/01/2019 Delivery date:11/08/2019  Delivering provider: Kenard Morawski  Date of discharge: 11/11/2019  Admitting diagnosis: Premature rupture of membranes [O42.90] Intrauterine pregnancy: [redacted]w[redacted]d    Secondary diagnosis:  Principal Problem:   Preterm premature rupture of membranes (PPROM) in third trimester, antepartum Active Problems:   Supervision of high-risk pregnancy   Positive GBS test   Umbilical cord prolapse     Discharge diagnosis: Preterm Pregnancy Delivered                                              Post partum procedures: none Augmentation: N/A Complications: Umbilical cord prolapse  Hospital course: Sceduled C/S   39y.o. yo [id number] 285w1das admitted to the hospital 11/01/2019 for PPROM at 2877w1dhe received BMZ x2 as well as Magnesium for neuro-protection. Due to umbilical cord prolapse at 29w47w0dtient went for emergent cesarean section. Delivery details are as follows:  Membrane Rupture Time/Date: 2:15 PM ,11/01/2019   Delivery Method:C-Section, Low Transverse  Details of operation can be found in separate operative note.  Patient had an uncomplicated postpartum course.  She is ambulating, tolerating a regular diet, passing flatus, and urinating well. Patient is discharged home in stable condition on  11/11/19        Newborn Data: Birth date:11/08/2019  Birth time:9:28 AM  Gender:Female  Living status:Living  Apgars:0 ,8  Weight:1250 g     Magnesium Sulfate received: Yes: Neuroprotection BMZ received: Yes Rhophylac:No MMR:No   Physical exam  Vitals:   11/10/19 1534 11/10/19 1931 11/10/19 2324 11/11/19 0749  BP: 125/64 113/81 110/72 108/67  Pulse: (!) 113 (!) 112 (!) 104 100  Resp: _0 Temp: 98.1 F (36.7 C) 98.4 F (36.9 C) 98.1 F (36.7 C) 97.8 F (36.6 C)  TempSrc: Oral Oral Oral Oral  SpO2: 100% 100% 100%  98%  Weight:      Height:       General: alert, cooperative and no distress Lochia: appropriate Uterine Fundus: firm Incision: Dressing is clean, dry, and intact DVT Evaluation: No evidence of DVT seen on physical exam. Labs: Lab Results  Component Value Date   WBC 15.7 (H) 11/09/2019   HGB 8.1 (L) 11/09/2019   HCT 25.2 (L) 11/09/2019   MCV 94.4 11/09/2019   PLT 292 11/09/2019   CMP Latest Ref Rng & Units 11/08/2019  Creatinine 0.44 - 1.00 mg/dL 0.81   Edinburgh Score: Edinburgh Postnatal Depression Scale Screening Tool 11/10/2019  I have been able to laugh and see the funny side of things. 0  I have looked forward with enjoyment to things. 0  I have blamed myself unnecessarily when things went wrong. 1  I have been anxious or worried for no good reason. 1  I have felt scared or panicky for no good reason. 1  Things have been getting on top of me. 1  I have been so unhappy that I have had difficulty sleeping. 1  I have felt sad or miserable. 1  I have been so unhappy that I have been crying. 1  The thought of harming myself has occurred to me. 0  Edinburgh Postnatal Depression Scale Total 7     After visit meds:  Allergies as of  11/11/2019      Reactions   Latex Rash      Medication List    STOP taking these medications   Olopatadine HCl 0.2 % Soln   tinidazole 500 MG tablet Commonly known as: Tindamax     TAKE these medications   ALPRAZolam 0.5 MG tablet Commonly known as: XANAX Take 0.5 mg by mouth at bedtime as needed for sleep.   diphenhydramine-acetaminophen 25-500 MG Tabs tablet Commonly known as: TYLENOL PM Take 1 tablet by mouth at bedtime as needed (sleep).   ferrous sulfate 325 (65 FE) MG tablet Take 1 tablet (325 mg total) by mouth 2 (two) times daily with a meal.   ibuprofen 600 MG tablet Commonly known as: ADVIL Take 1 tablet (600 mg total) by mouth every 6 (six) hours as needed.   oxyCODONE-acetaminophen 5-325 MG tablet Commonly known  as: PERCOCET/ROXICET Take 1 tablet by mouth every 4 (four) hours as needed.   PrePLUS 27-1 MG Tabs Take 1 tablet by mouth daily.        Discharge home in stable condition Infant Feeding: Breast Infant Disposition:NICU Discharge instruction: per After Visit Summary and Postpartum booklet. Activity: Advance as tolerated. Pelvic rest for 6 weeks.  Diet: routine diet Future Appointments: Future Appointments  Date Time Provider [place]  11/16/2019 10:40 AM [place] None  12/13/2019  3:00 PM Karel Mowers, Vickii Chafe, MD [place] None   Follow up Visit: [place] Follow up.   Why: As scheduled on 5/25 Contact information: [place] [id number] [phone]           Please schedule this patient for a Virtual postpartum visit in 4 weeks with the following provider: Any provider. Additional Postpartum F/U:Incision check 1 week  High risk pregnancy complicated by: PPROM Delivery mode:  C-Section, Low Transverse  Anticipated Birth Control:  Unsure   11/11/2019 Mora Bellman, MD

## 2019-11-08 NOTE — Anesthesia Procedure Notes (Signed)
Procedure Name: Intubation Date/Time: 11/08/2019 9:27 AM Performed by: Ignacia Bayley, CRNA Pre-anesthesia Checklist: Patient identified, Patient being monitored, Timeout performed, Emergency Drugs available and Suction available Patient Re-evaluated:Patient Re-evaluated prior to induction Oxygen Delivery Method: Circle System Utilized Preoxygenation: Pre-oxygenation with 100% oxygen Induction Type: IV induction, Cricoid Pressure applied and Rapid sequence Ventilation: Mask ventilation without difficulty Laryngoscope Size: Mac, 3 and Glidescope Grade View: Grade I Tube type: Oral Tube size: 7.0 mm Number of attempts: 1 Airway Equipment and Method: stylet Placement Confirmation: ETT inserted through vocal cords under direct vision,  positive ETCO2 and breath sounds checked- equal and bilateral Secured at: 21 cm Tube secured with: Tape Dental Injury: Teeth and Oropharynx as per pre-operative assessment

## 2019-11-08 NOTE — Anesthesia Postprocedure Evaluation (Signed)
Anesthesia Post Note  Patient: Katie Carson  Procedure(s) Performed: CESAREAN SECTION (N/A )     Patient location during evaluation: PACU Anesthesia Type: General Level of consciousness: awake and alert Pain management: pain level controlled Vital Signs Assessment: post-procedure vital signs reviewed and stable Respiratory status: spontaneous breathing, nonlabored ventilation and respiratory function stable Cardiovascular status: blood pressure returned to baseline and stable Postop Assessment: no apparent nausea or vomiting Anesthetic complications: no    Last Vitals:  Vitals:   11/08/19 1100 11/08/19 1140  BP: 119/64 (!) 135/95  Pulse: 87 90  Resp: 13 16  Temp:  36.6 C  SpO2: 100% 99%    Last Pain:  Vitals:   11/08/19 1140  TempSrc:   PainSc: 3    Pain Goal: Patients Stated Pain Goal: 3 (11/08/19 1140)                 Lucretia Kern

## 2019-11-08 NOTE — Lactation Note (Addendum)
This note was copied from a baby's chart. Lactation Consultation Note  Patient Name: Katie Carson FMBWG'Y Date: 11/08/2019 Reason for consult: Initial assessment;NICU baby;Preterm <34wks;Infant < 6lbs  LC in to visit with P3 Mom of [redacted]w[redacted]d infant in the NICU.  Baby is 4 hrs old.  Mom had an emergency C/S for prolapsed cord.   Mom is uncomfortable with surgical pain, but wanted to start pumping.  Set Mom up with DEBP and assisted with first pumping.  24 mm flanges are a good fit currently.  Mom wearing a "belly band" that she wore while being on fetal monitor.  Pulled "belly band" up over breasts, cut a small hole for flanges and made a hand's free bra.  Mom expressing a great flow of what looks like transitional breast milk.  Requested RN to obtain breast milk labels and colostrum containers and breast milk bottles given to Mom.   Mom informed on importance of disassembling pump parts, washing, rinsing and air drying parts in separate bin on counter.  Encouraged Mom to double pump every 2-3 hrs during the day and 3-4 hrs at night.  Mom is very motivated to provide milk for her baby.  Mom has food stamps, but doesn't have WIC.  Mom was loaned a Medela pump from her sister.  Explained the difference in pumps and importance of a strong hospital grade pump.  Mom told about Medela Symphony DEBP in baby's room for her to use.  Mom agreeable to [facility] faxing referral to [facility] for a pump on discharge.   Lactation brochure and NICU booklet given to Mom.  Mom aware of IP and OP lactation support available to her.   Encouraged Mom to ask for help prn.  Interventions Interventions: Breast feeding basics reviewed;Skin to skin;Breast massage;Hand express;DEBP;Support pillows  Lactation Tools Discussed/Used Tools: Pump;Flanges Flange Size: 24 Breast pump type: Double-Electric Breast Pump WIC Program: No Pump Review: Setup, frequency, and cleaning;Milk Storage Initiated by:: Erby Pian  RN IBCLC Date initiated:: 11/08/19   Consult Status Consult Status: Follow-up Date: 11/09/19 Follow-up type: In-patient    [name] 11/08/2019, 1:56 PM

## 2019-11-08 NOTE — Progress Notes (Signed)
Physician in room  With pt  Pt just came out of bathroom with cord prolapse  Stat   c-section called  Pt rushed to or with MD relieving cord pressure in bed with patient

## 2019-11-08 NOTE — Addendum Note (Signed)
Addendum  created 11/08/19 1446 by Algis Greenhouse, CRNA   Attestation recorded in [place], Flowsheet accepted, [employer] filed, Intraprocedure Event edited, International aid/development worker edited

## 2019-11-08 NOTE — Transfer of Care (Signed)
Immediate Anesthesia Transfer of Care Note  Patient: Katie Carson  Procedure(s) Performed: CESAREAN SECTION (N/A )  Patient Location: PACU  Anesthesia Type:General  Level of Consciousness: awake  Airway & Oxygen Therapy: Patient Spontanous Breathing  Post-op Assessment: Report given to RN and Post -op Vital signs reviewed and stable  Post vital signs: Reviewed and stable  Last Vitals:  Vitals Value Taken Time  BP 117/76 11/08/19 1018  Temp    Pulse 95 11/08/19 1021  Resp 16 11/08/19 1021  SpO2 100 % 11/08/19 1021  Vitals shown include unvalidated device data.  Last Pain:  Vitals:   11/08/19 0851  TempSrc: Oral  PainSc:       Patients Stated Pain Goal: 2 (11/07/19 1956)  Complications: No apparent anesthesia complications

## 2019-11-08 NOTE — Anesthesia Preprocedure Evaluation (Signed)
Anesthesia Evaluation   Patient awake    Reviewed: Allergy & PrecautionsPreop documentation limited or incomplete due to emergent nature of procedure.  History of Anesthesia Complications Negative for: history of anesthetic complications  Airway        Dental   Pulmonary Current Smoker,    Pulmonary exam normal        Cardiovascular Normal cardiovascular exam     Neuro/Psych    GI/Hepatic   Endo/Other    Renal/GU      Musculoskeletal   Abdominal   Peds  Hematology   Anesthesia Other Findings  [redacted] week gestation, prolapsed cord for emergency C section  Reproductive/Obstetrics (+) Pregnancy                             Anesthesia Physical Anesthesia Plan  ASA: III and emergent  Anesthesia Plan: General ETT, Rapid Sequence and Cricoid Pressure   Post-op Pain Management:    Induction:   PONV Risk Score and Plan: 4 or greater and Treatment may vary due to age or medical condition, Ondansetron, Dexamethasone and Midazolam  Airway Management Planned:   Additional Equipment:   Intra-op Plan:   Post-operative Plan:   Informed Consent:     Only emergency history available  Plan Discussed with:   Anesthesia Plan Comments:         Anesthesia Quick Evaluation

## 2019-11-08 NOTE — Plan of Care (Signed)
  Problem: Clinical Measurements: Goal: Complications related to the disease process, condition or treatment will be avoided or minimized Outcome: Progressing   

## 2019-11-09 ENCOUNTER — Encounter: Payer: Self-pay | Admitting: *Deleted

## 2019-11-09 LAB — CBC
HCT: 25.2 % — ABNORMAL LOW (ref 36.0–46.0)
Hemoglobin: 8.1 g/dL — ABNORMAL LOW (ref 12.0–15.0)
MCH: 30.3 pg (ref 26.0–34.0)
MCHC: 32.1 g/dL (ref 30.0–36.0)
MCV: 94.4 fL (ref 80.0–100.0)
Platelets: 292 10*3/uL (ref 150–400)
RBC: 2.67 MIL/uL — ABNORMAL LOW (ref 3.87–5.11)
RDW: 13.1 % (ref 11.5–15.5)
WBC: 15.7 10*3/uL — ABNORMAL HIGH (ref 4.0–10.5)
nRBC: 0 % (ref 0.0–0.2)

## 2019-11-09 MED ORDER — FERROUS SULFATE 325 (65 FE) MG PO TABS
325.0000 mg | ORAL_TABLET | Freq: Three times a day (TID) | ORAL | Status: DC
  Administered 2019-11-09 – 2019-11-11 (×7): 325 mg via ORAL
  Filled 2019-11-09 (×7): qty 1

## 2019-11-09 MED ORDER — ACETAMINOPHEN 325 MG PO TABS
650.0000 mg | ORAL_TABLET | ORAL | Status: DC | PRN
  Administered 2019-11-10 (×2): 650 mg via ORAL
  Filled 2019-11-09 (×2): qty 2

## 2019-11-09 NOTE — Lactation Note (Signed)
This note was copied from a baby's chart. Lactation Consultation Note  Patient Name: Boy Hayzlee Mcsorley   IXBOE'R Date: 11/09/2019   Mother reports that she is in a lot of pain through the night and this am. She reports that she is pumping but not regularly. Mother has her pump at the bedside. She is very appreciative of all LC's coming in to assist her and give her good support.  Mother is eating her lunch at this time and plans to pump afterwards.  Mother hopes to see infant soon when pain subsides. . Pump using a DEBP after each feeding for 15-20 mins.  Mother is aware of importance of cleaning pump parts.  Mother very pleasant. She denies having any concerns.   Mother is aware of available LC services at [facility], BFSG'S, OP Dept, and phone # for questions or concerns about breastfeeding.  Mother receptive to all teaching and plan of care.       Maternal Data    Feeding Feeding Type: Formula  LATCH Score                   Interventions    Lactation Tools Discussed/Used     Consult Status      Michel Bickers 11/09/2019, 3:39 PM

## 2019-11-09 NOTE — Progress Notes (Signed)
Subjective: Postpartum Day 1: Cesarean Delivery Patient reports doing well. She denies CP, SOB, lightheadedness/dizziness.    Objective: Vital signs in last 24 hours: Temp:  [97.5 F (36.4 C)-98.3 F (36.8 C)] 97.9 F (36.6 C) (05/18 0751) Pulse Rate:  [76-95] 86 (05/18 0751) Resp:  [13-19] 18 (05/18 0751) BP: (99-138)/(61-95) 112/63 (05/18 0751) SpO2:  [98 %-100 %] 98 % (05/18 0751)  Physical Exam:  General: alert, cooperative and no distress Lochia: appropriate Uterine Fundus: firm Incision: dressing clean dry and intact DVT Evaluation: No evidence of DVT seen on physical exam.  Recent Labs    11/08/19 0935 11/09/19 0429  HGB 10.1* 8.1*  HCT 30.6* 25.2*    Assessment/Plan: Status post Cesarean section. Doing well postoperatively.  Continue current care Patient with asymptomatic anemia- Patient desires iron supplements for now .  Katie Carson 11/09/2019, 9:34 AM

## 2019-11-10 LAB — SURGICAL PATHOLOGY

## 2019-11-10 NOTE — Progress Notes (Signed)
CSW acknowledges consult and completed clinical assessment.  Clinical documentation will follow.  There are no barriers to d/c.  Laqueena Hinchey Boyd-Gilyard, MSW, LCSW Clinical Social Work [phone]   

## 2019-11-10 NOTE — Progress Notes (Signed)
Subjective: Postpartum Day 2: Cesarean Delivery Patient reports doing well without complaints. She is ambulating and voiding without difficulty. She is tolerating a regular diet.    Objective: Vital signs in last 24 hours: Temp:  [97.7 F (36.5 C)-98.1 F (36.7 C)] 98 F (36.7 C) (05/19 0735) Pulse Rate:  [82-111] 82 (05/19 0735) Resp:  [18] 18 (05/19 0735) BP: (93-119)/(57-75) 93/57 (05/19 0735) SpO2:  [98 %-100 %] 98 % (05/19 0735)  Physical Exam:  General: alert, cooperative and no distress Lochia: appropriate Uterine Fundus: firm Incision: honeycomb dressing DVT Evaluation: No evidence of DVT seen on physical exam.  Recent Labs    11/08/19 0935 11/09/19 0429  HGB 10.1* 8.1*  HCT 30.6* 25.2*    Assessment/Plan: Status post Cesarean section. Doing well postoperatively.  Continue current care Plan for discharge tomorrow.  Katie Carson 11/10/2019, 10:13 AM

## 2019-11-10 NOTE — Lactation Note (Addendum)
This note was copied from a baby's chart. Lactation Consultation Note  Patient Name: Boy Michalina Calbert ASNKN'L Date: 11/10/2019  Baby boy [place] now 75 hours old.  Mom reports she is in a lot of pain and hasn't pumped today or much last night.  Mom report she plans to go to the bathroom first and then try and pump.  Mom reports she will be d/c tomorrow.  Mom reports she has  a DEBP and a manual pump for home use.  Urged mom to try and empty her bladder prior to pumping. Mom reports she got quite a bit of milk first day she pumped and hasnt gotten much since.  Mom Reports didn't really get anything second day when she pumped.  Urged to pump 8-12 times day. Discussed option of donor milk with mom.  Mom reports she usually gets milk quickly so didn't feel like they would need it.  Explained frequency of pumping/removal makes you have more.  Mom knows to call lactation with any questions/concerns.   Maternal Data    Feeding    LATCH Score                   Interventions    Lactation Tools Discussed/Used     Consult Status      Julez Huseby Michaelle Copas 11/10/2019, 12:33 PM

## 2019-11-11 MED ORDER — OXYCODONE-ACETAMINOPHEN 5-325 MG PO TABS: 1.0000 | ORAL_TABLET | ORAL | 0 refills | Status: AC | PRN

## 2019-11-11 MED ORDER — OXYCODONE-ACETAMINOPHEN 5-325 MG PO TABS: 1.0000 | ORAL_TABLET | Freq: Four times a day (QID) | ORAL | 0 refills | Status: AC | PRN

## 2019-11-11 MED ORDER — FERROUS SULFATE 325 (65 FE) MG PO TABS: 325.0000 mg | ORAL_TABLET | Freq: Two times a day (BID) | ORAL | 3 refills | Status: AC

## 2019-11-11 MED ORDER — IBUPROFEN 600 MG PO TABS: 600.0000 mg | ORAL_TABLET | Freq: Four times a day (QID) | ORAL | 3 refills | Status: AC | PRN

## 2019-11-11 MED ORDER — FERROUS SULFATE 325 (65 FE) MG PO TABS: 325.0000 mg | ORAL_TABLET | Freq: Two times a day (BID) | ORAL | 1 refills | Status: AC

## 2019-11-11 MED FILL — FERROUS SULFATE 325 MG TAB: 325 (65 FE) | 30 days supply | Qty: 60 | Fill #0

## 2019-11-11 MED FILL — IBUPROFEN 600 MG TABLET: 600 | 15 days supply | Qty: 60 | Fill #0

## 2019-11-11 MED FILL — OXYCODONE-APAP 5-325MG: 5-325 | 5 days supply | Qty: 20 | Fill #0

## 2019-11-11 NOTE — Progress Notes (Signed)
Teaching complete d/c to nicu  To see baby

## 2019-11-11 NOTE — Plan of Care (Signed)
  Problem: Clinical Measurements: Goal: Complications related to the disease process, condition or treatment will be avoided or minimized Outcome: Completed/Met   Problem: Health Behavior/Discharge Planning: Goal: Ability to manage health-related needs will improve Outcome: Completed/Met   Problem: Activity: Goal: Risk for activity intolerance will decrease Outcome: Completed/Met   Problem: Nutrition: Goal: Adequate nutrition will be maintained Outcome: Completed/Met   Problem: Coping: Goal: Level of anxiety will decrease Outcome: Completed/Met   Problem: Education: Goal: Knowledge of condition will improve Outcome: Completed/Met   Problem: Activity: Goal: Will verbalize the importance of balancing activity with adequate rest periods Outcome: Completed/Met Goal: Ability to tolerate increased activity will improve Outcome: Completed/Met   Problem: Coping: Goal: Ability to identify and utilize available resources and services will improve Outcome: Completed/Met   Problem: Role Relationship: Goal: Ability to demonstrate positive interaction with newborn will improve Outcome: Completed/Met

## 2019-11-11 NOTE — Discharge Instructions (Signed)
Cesarean Delivery, Care After This sheet gives you information about how to care for yourself after your procedure. Your health care provider may also give you more specific instructions. If you have problems or questions, contact your health care provider. What can I expect after the procedure? After the procedure, it is common to have:  A small amount of blood or clear fluid coming from the incision.  Some redness, swelling, and pain in your incision area.  Some abdominal pain and soreness.  Vaginal bleeding (lochia). Even though you did not have a vaginal delivery, you will still have vaginal bleeding and discharge.  Pelvic cramps.  Fatigue. You may have pain, swelling, and discomfort in the tissue between your vagina and your anus (perineum) if:  Your C-section was unplanned, and you were allowed to labor and push.  An incision was made in the area (episiotomy) or the tissue tore during attempted vaginal delivery. Follow these instructions at home: Incision care   Follow instructions from your health care provider about how to take care of your incision. Make sure you: ? Wash your hands with soap and water before you change your bandage (dressing). If soap and water are not available, use hand sanitizer. ? If you have a dressing, change it or remove it as told by your health care provider. ? Leave stitches (sutures), skin staples, skin glue, or adhesive strips in place. These skin closures may need to stay in place for 2 weeks or longer. If adhesive strip edges start to loosen and curl up, you may trim the loose edges. Do not remove adhesive strips completely unless your health care provider tells you to do that.  Check your incision area every day for signs of infection. Check for: ? More redness, swelling, or pain. ? More fluid or blood. ? Warmth. ? Pus or a bad smell.  Do not take baths, swim, or use a hot tub until your health care provider says it's okay. Ask your health  care provider if you can take showers.  When you cough or sneeze, hug a pillow. This helps with pain and decreases the chance of your incision opening up (dehiscing). Do this until your incision heals. Medicines  Take over-the-counter and prescription medicines only as told by your health care provider.  If you were prescribed an antibiotic medicine, take it as told by your health care provider. Do not stop taking the antibiotic even if you start to feel better.  Do not drive or use heavy machinery while taking prescription pain medicine. Lifestyle  Do not drink alcohol. This is especially important if you are breastfeeding or taking pain medicine.  Do not use any products that contain nicotine or tobacco, such as cigarettes, e-cigarettes, and chewing tobacco. If you need help quitting, ask your health care provider. Eating and drinking  Drink at least 8 eight-ounce glasses of water every day unless told not to by your health care provider. If you breastfeed, you may need to drink even more water.  Eat high-fiber foods every day. These foods may help prevent or relieve constipation. High-fiber foods include: ? Whole grain cereals and breads. ? Brown rice. ? Beans. ? Fresh fruits and vegetables. Activity   If possible, have someone help you care for your baby and help with household activities for at least a few days after you leave the hospital.  Return to your normal activities as told by your health care provider. Ask your health care provider what activities are safe for   you.  Rest as much as possible. Try to rest or take a nap while your baby is sleeping.  Do not lift anything that is heavier than 10 lbs (4.5 kg), or the limit that you were told, until your health care provider says that it is safe.  Talk with your health care provider about when you can engage in sexual activity. This may depend on your: ? Risk of infection. ? How fast you heal. ? Comfort and desire to  engage in sexual activity. General instructions  Do not use tampons or douches until your health care provider approves.  Wear loose, comfortable clothing and a supportive and well-fitting bra.  Keep your perineum clean and dry. Wipe from front to back when you use the toilet.  If you pass a blood clot, save it and call your health care provider to discuss. Do not flush blood clots down the toilet before you get instructions from your health care provider.  Keep all follow-up visits for you and your baby as told by your health care provider. This is important. Contact a health care provider if:  You have: ? A fever. ? Bad-smelling vaginal discharge. ? Pus or a bad smell coming from your incision. ? Difficulty or pain when urinating. ? A sudden increase or decrease in the frequency of your bowel movements. ? More redness, swelling, or pain around your incision. ? More fluid or blood coming from your incision. ? A rash. ? Nausea. ? Little or no interest in activities you used to enjoy. ? Questions about caring for yourself or your baby.  Your incision feels warm to the touch.  Your breasts turn red or become painful or hard.  You feel unusually sad or worried.  You vomit.  You pass a blood clot from your vagina.  You urinate more than usual.  You are dizzy or light-headed. Get help right away if:  You have: ? Pain that does not go away or get better with medicine. ? Chest pain. ? Difficulty breathing. ? Blurred vision or spots in your vision. ? Thoughts about hurting yourself or your baby. ? New pain in your abdomen or in one of your legs. ? A severe headache.  You faint.  You bleed from your vagina so much that you fill more than one sanitary pad in one hour. Bleeding should not be heavier than your heaviest period. Summary  After the procedure, it is common to have pain at your incision site, abdominal cramping, and slight bleeding from your vagina.  Check  your incision area every day for signs of infection.  Tell your health care provider about any unusual symptoms.  Keep all follow-up visits for you and your baby as told by your health care provider. This information is not intended to replace advice given to you by your health care provider. Make sure you discuss any questions you have with your health care provider. Document Revised: 12/17/2017 Document Reviewed: 12/17/2017 Elsevier Patient Education  2020 Elsevier Inc.  

## 2019-11-11 NOTE — Clinical Social Work Maternal (Signed)
CLINICAL SOCIAL WORK MATERNAL/CHILD NOTE  Patient Details  Name: Katie Carson MRN: [id number] Date of Birth: [birth year]  Date:  11/11/2019  Clinical Social Worker Initiating Note:  Glenard Haring Boyd-Gilyard Date/Time: Initiated:  11/10/19/1030     Child's Name:  Parent's have not named infant.   Biological Parents:  Mother, Father   Need for Interpreter:  None   Reason for Referral:  Behavioral Health Concerns, Parental Support of Premature Babies < 32 weeks/or Critically Ill babies   Address:  [place] [address]    Phone number:  [phone] (home)     Additional phone number: FOB's number   Household Members/Support Persons (HM/SP):   Household Member/Support Person 1, Household Member/Support Person 2, Household Member/Support Person 3   HM/SP Name Relationship DOB or Age  HM/SP -1 Mekose Siler FOB 06/23/1982  HM/SP -2 Drema Pry daughter 05/12/2004  HM/SP -3 Donte Hall son 03/27/2009  HM/SP -4        HM/SP -5        HM/SP -6        HM/SP -7        HM/SP -8          Natural Supports (not living in the home):  Immediate Family, Extended Family, Spouse/significant other(MOB reported that FOB's family will also provide support if needed.)   Professional Supports:     Employment: Unemployed   Type of Work:     Education:  Programmer, systems   Homebound arranged:    Museum/gallery curator Resources:  Medicaid   Other Resources:  Physicist, medical     Cultural/Religious Considerations Which May Impact Care:  None reported  Strengths:  Ability to meet basic needs  , Home prepared for child  , Understanding of illness, Pediatrician chosen   Psychotropic Medications:         Pediatrician:    [place]olicitor area  Pediatrician List:   [facility]      Pediatrician Fax Number:    Risk Factors/Current Problems:  Mental Health  Concerns     Cognitive State:  Linear Thinking  , Insightful  , Goal Oriented     Mood/Affect:  Happy  , Bright  , Interested  , Comfortable     CSW Assessment: CSW met with MOB at her bedside in room 115 to complete an assessment for MH hx and NICU admission.  CSW explained CSW's role and MOB was receptive to meeting with CSW.  MOB was polite and easy to engage.  CSW asked about MOB's thoughts and feelings about infant's NICU admission.  MOB shared feeling "Pretty Good," and openly shared her birthing experience. MOB communicated, "I thought I was going to have anxiety and be all stressed out but I actually feel good.  Knowing that my baby is doing ok is help."  CSW validated and normalized MOB's thoughts and feelings.  CSW also shared other emotions that MOB may experience postpartum while having a baby in the NICU. MOB acknowledged having a hx of anxiety and reported she dx "In her early 87's. MOB reported her symptoms were managed with medication until about 2 years ago.  MOB reported she discontinued her medications and has had little to no symptoms. CSW provided education regarding the baby blues period vs. perinatal mood disorders, discussed treatment and gave resources for mental  health follow up if concerns arise.  CSW recommends self-evaluation during the postpartum time period using the New Mom Checklist from Postpartum Progress and encouraged MOB to contact a medical professional if symptoms are noted at any time. MOB did not present with any acute MH symptoms and appeared to have insight and awareness. MOB shared feeling comfortable seeking help if help is warranted.  CSW assessed for safety and MOB denied SI, HI, and DV.   CSW had questions about completing the birth certificate worksheet for infant and CSW agreed to have hospital birth registry to reach out to [facility] to address her questions and concerns.   CSW denied having psychosocial stressors and reported having reliable transportation.    CSW will continue to offer resources and supports to family while infant remains in NICU.    CSW Plan/Description:  Psychosocial Support and Ongoing Assessment of Needs, Sudden Infant Death Syndrome (SIDS) Education, Perinatal Mood and Anxiety Disorder (PMADs) Education, Other Patient/Family Education, Theatre stage manager Income (SSI) Information   Laurey Arrow, MSW, LCSW Clinical Social Work [phone]   Dimple Nanas, [place] 11/11/2019, 10:46 AM

## 2019-11-13 ENCOUNTER — Ambulatory Visit: Payer: Self-pay

## 2019-11-13 NOTE — Lactation Note (Signed)
This note was copied from a baby's chart. Lactation Consultation Note  Patient Name: Boy Kerline Trahan EXNTZ'G Date: 11/13/2019 Reason for consult: Follow-up assessment;NICU baby  1336 - 1354 - I followed up with Ms. Baynes today to check on her progress with breast pumping.  She states that she is pumping one time a day and it's going well. She has been rooming in with her son, Peri Maris, but she will start going home a bit more often to see her older children.  I asked her if she had a pump at home. She states that she has a pump at home donated by her sister, and she will see today what kind it is. She does not currently use WIC, but she states that they did contact her after [facility]) put in a referral. She is not sure if she wants to obtain a pump from [facility], but she is aware of their services. She states that she will call if she wants to go this route.  I reviewed her pumping strategy and her long term goals. She states that for a long term goal is to provide breast milk for son for 6-8 months. She states that she is flexible with how she does this, pumping or breast feeding. I praised her for having this great goal, and suggested that she increase her pumping frequency to 8 times a day to make sufficient milk for when Peri Maris is older. I emphasized that frequency is more effective than length of time for pumping for breast milk production (she states that she pumped for 2 hours today). I encouraged her to pump 8 times a day for 15-20 minutes. I also reviewed milk storage guidelines and encouraged her to freeze milk pumped in excess of Nekose's current needs.  Ms. Giancola verbalized understanding. I encouraged her to call as needed. Recommend LC follow up in one week to check on progress.   Feeding Feeding Type: Breast Milk  Interventions Interventions: Breast feeding basics reviewed  Lactation Tools Discussed/Used Pump Review: Setup, frequency, and cleaning;Milk  Storage   Consult Status Consult Status: Follow-up Date: 11/20/19 Follow-up type: In-patient    Naamah Boggess 11/13/2019, 2:38 PM

## 2019-11-16 ENCOUNTER — Ambulatory Visit: Payer: Self-pay

## 2019-11-16 ENCOUNTER — Other Ambulatory Visit: Payer: Self-pay

## 2019-11-16 ENCOUNTER — Ambulatory Visit (INDEPENDENT_AMBULATORY_CARE_PROVIDER_SITE_OTHER): Payer: Medicaid Other | Admitting: Advanced Practice Midwife

## 2019-11-16 NOTE — Progress Notes (Signed)
Presents for Incision check.  Reports no problems.  She wants Tubulization for [facility].  Forms signed 11/16/19.

## 2019-11-16 NOTE — Lactation Note (Signed)
This note was copied from a baby's chart. Lactation Consultation Note  Patient Name: Katie Carson Date: 11/16/2019 Reason for consult: Follow-up assessment;NICU baby;Preterm <34wks;Infant < 6lbs  LC in to visit with P3 Mom of 8 day old baby in the NICU.  AGA [redacted]w[redacted]d and taking fortified EBM by gavage.  Mom pumping 4 oz per pumping on average.  Mom denies any breast pain, nipple pain, or any questions regarding pumping.   Mom is rooming-in and has been diligent with pumping.  Mom feeling good and has a follow-up MD appt this am.    Encouraged STS and pumping 8-12 times per 24 hrs.  Mom using Medela Symphony DEBP at bedside, and will be obtaining a WIC pump soon. Mom knows she call LC for questions or concerns.   Interventions Interventions: Breast feeding basics reviewed;Skin to skin;Breast massage;Hand express;DEBP  Lactation Tools Discussed/Used Tools: Pump Breast pump type: Double-Electric Breast Pump   Consult Status Consult Status: Follow-up Date: 11/23/19 Follow-up type: In-patient    Katie Carson 11/16/2019, 9:06 AM

## 2019-11-16 NOTE — Progress Notes (Signed)
S: 39 y.o. who is s/p PLTCS on 11/08/19 presents to [facility] for incision check. She reports mild incisional pain, not requiring medications at this time. There are no other complications. Baby was born at 96 weeks and is in NICU doing well.  O: BP 124/73   Pulse 85   Ht 5\' 7"  (1.702 m)   Wt 170 lb 11.2 oz (77.4 kg)   Breastfeeding Yes   BMI 26.74 kg/m   VS reviewed, nursing note reviewed,  Constitutional: well developed, well nourished, no distress HEENT: normocephalic CV: normal rate Pulm/chest wall: normal effort Abdomen: soft Neuro: alert and oriented x 3 Skin: warm, dry, incision well approximated without edema, erythema or exudate Psych: affect normal  I was called to room by RN to assist with removal of Honeycomb dressing. Dressing was clean/dry/intact and was removed without difficulty prior to incision evaluation.  A: Normal incision check  P:  D/C home Keep PP visit scheduled   , CNM

## 2019-11-23 ENCOUNTER — Ambulatory Visit: Payer: Self-pay

## 2019-11-23 NOTE — Lactation Note (Signed)
This note was copied from a baby's chart. Lactation Consultation Note  Patient Name: Katie Carson [id number] Date: 11/23/2019 Reason for consult: Follow-up assessment;NICU baby;Preterm <34wks;Infant < 6lbs  LC visited with Mom of preterm baby at 43 weeks old.  Mom's milk supply has decreased to about 15 ml per breast. She has been pumping twice a day last few days.  Mom states she got away from her regular pumping and wonders if it was too late to build her supply back.  Encouraged Mom to resume her regular >8 times a day double pumping, adding breast massage and hand expression.  Empathized with Mom about the stresses in her life at home.  Encouraged sleep and good nutrition and regular pumping.  Mom appreciative of LC stopping in and supporting her effort.  Interventions Interventions: Breast feeding basics reviewed;Skin to skin;Breast massage;Hand express;DEBP  Lactation Tools Discussed/Used Tools: Pump Breast pump type: Double-Electric Breast Pump   Consult Status Consult Status: Follow-up Date: 11/30/19 Follow-up type: In-patient    Katie Carson 11/23/2019, 6:56 PM

## 2019-12-13 ENCOUNTER — Telehealth (INDEPENDENT_AMBULATORY_CARE_PROVIDER_SITE_OTHER): Payer: Medicaid Other | Admitting: Obstetrics and Gynecology

## 2019-12-13 ENCOUNTER — Encounter: Payer: Self-pay | Admitting: Obstetrics and Gynecology

## 2019-12-13 NOTE — Progress Notes (Signed)
      I connected with@ on 12/13/19 at  3:00 PM EDT by: Mychart video and verified that I am speaking with the correct person using two identifiers.  Patient is located at [facility] and provider is located at [employer] for [employer] at [place] .     The purpose of this virtual visit is to provide medical care while limiting exposure to the novel coronavirus. I discussed the limitations, risks, security and privacy concerns of performing an evaluation and management service by MyChart and the availability of in person appointments. I also discussed with the patient that there may be a patient responsible charge related to this service. By engaging in this virtual visit, you consent to the provision of healthcare.  Additionally, you authorize for your insurance to be billed for the services provided during this visit.  The patient expressed understanding and agreed to proceed.   Post Partum Visit Note Subjective:   Katie Carson is a 39 y.o. [phone] female being evaluated for postpartum followup.  She is 5 weeks postpartum following a primary cesarean section at  51 gestational weeks due to PPROM with cord prolapse.  I have fully reviewed the prenatal and intrapartum course.  Postpartum course has been uncomplicated. Baby is doing well and is still in NICU. Baby is feeding by bottle - Formula. Bleeding no bleeding. Bowel function is normal. Bladder function is normal. Patient is not sexually active. Contraception method is abstinence. Postpartum depression screening: negative.     Review of Systems Pertinent items noted in HPI and remainder of comprehensive ROS otherwise negative.   Objective:  There were no vitals filed for this visit. Self-Obtained       Assessment:    Normal postpartum exam.  Plan:  Essential components of care per ACOG recommendations:  1.  Mood and well being: Patient with negative depression screening today. Reviewed local resources for support.    2.  Infant care and feeding:  -Patient currently breastmilk feeding? No  -Social determinants of health (SDOH) reviewed in EPIC. No concerns   3. Sexuality, contraception and birth spacing - Patient does not want a pregnancy in the next year.  Desired family size is 3 children.  - Reviewed forms of contraception in tiered fashion. Patient desired bilateral tubal ligation.   - Patient signed BTL forms a few weeks ago. Risks, benefits and alternatives were explained. Patient will be scheduled for lsc salpingectomy  4. Sleep and fatigue -Encouraged family/partner/community support of 4 hrs of uninterrupted sleep to help with mood and fatigue  5. Physical Recovery  - Discussed patients delivery and complications - Patient is safe to resume physical and sexual activity  6.  Health Maintenance - Last pap smear done 07/2019 and was normal with negative HPV.    10 minutes of non-face-to-face time spent with the patient    Catalina Antigua, MD [facility] for [facility], [facility]

## 2020-01-11 ENCOUNTER — Encounter (HOSPITAL_COMMUNITY): Payer: Self-pay

## 2020-01-11 ENCOUNTER — Emergency Department (HOSPITAL_COMMUNITY): Payer: Medicaid Other

## 2020-01-11 ENCOUNTER — Emergency Department (HOSPITAL_COMMUNITY)
Admission: EM | Admit: 2020-01-11 | Discharge: 2020-01-11 | Disposition: A | Discharge status: Home / Self Care | Payer: Medicaid Other | Attending: Emergency Medicine | Admitting: Emergency Medicine

## 2020-01-11 DIAGNOSIS — S93402A Sprain of unspecified ligament of left ankle, initial encounter: Secondary | ICD-10-CM | POA: Insufficient documentation

## 2020-01-11 DIAGNOSIS — Y999 Unspecified external cause status: Secondary | ICD-10-CM | POA: Diagnosis not present

## 2020-01-11 DIAGNOSIS — X501XXA Overexertion from prolonged static or awkward postures, initial encounter: Secondary | ICD-10-CM | POA: Diagnosis not present

## 2020-01-11 DIAGNOSIS — S99912A Unspecified injury of left ankle, initial encounter: Secondary | ICD-10-CM | POA: Diagnosis present

## 2020-01-11 DIAGNOSIS — Y92009 Unspecified place in unspecified non-institutional (private) residence as the place of occurrence of the external cause: Secondary | ICD-10-CM | POA: Insufficient documentation

## 2020-01-11 DIAGNOSIS — Y939 Activity, unspecified: Secondary | ICD-10-CM | POA: Insufficient documentation

## 2020-01-11 MED ORDER — KETOROLAC TROMETHAMINE 60 MG/2ML IM SOLN
30.0000 mg | Freq: Once | INTRAMUSCULAR | Status: AC
  Administered 2020-01-11: 30 mg via INTRAMUSCULAR
  Filled 2020-01-11: qty 2

## 2020-01-11 NOTE — ED Provider Notes (Signed)
[facility] EMERGENCY DEPARTMENT Provider Note   CSN: [id number] Arrival date & time: 01/11/20  0021     History Chief Complaint  Patient presents with  . Ankle Pain    Katie Carson is a 39 y.o. female.   Ankle Pain Location:  Ankle Injury: yes   Mechanism of injury: fall   Ankle location:  L ankle Pain details:    Quality:  Aching, dull and pressure   Radiates to:  Does not radiate   Severity:  Mild   Onset quality:  Gradual   Timing:  Constant Chronicity:  New Prior injury to area:  No Relieved by:  None tried Worsened by:  Nothing      Past Medical History:  Diagnosis Date  . Anxiety     Patient Active Problem List   Diagnosis Date Noted  . Umbilical cord prolapse 11/08/2019  . Positive GBS test 11/04/2019  . Preterm premature rupture of membranes (PPROM) in third trimester, antepartum 11/01/2019  . Late prenatal care affecting pregnancy 09/02/2019  . AMA (advanced maternal age) multigravida 35+, unspecified trimester 08/11/2019  . Supervision of high-risk pregnancy 08/10/2019    Past Surgical History:  Procedure Laterality Date  . CESAREAN SECTION N/A 11/08/2019   Procedure: CESAREAN SECTION;  Surgeon: Catalina Antigua, MD;  Location: [facility] LD ORS;  Service: Obstetrics;  Laterality: N/A;  . TONSILLECTOMY    . WISDOM TOOTH EXTRACTION       OB History    Gravida  7   Para  3   Term  1   Preterm  2   AB  4   Living  3     SAB  1   TAB      Ectopic      Multiple  0   Live Births  3           Family History  Problem Relation Age of Onset  . Cancer Father   . Cancer Maternal Grandmother   . Heart disease Maternal Grandfather     Social History   Tobacco Use  . Smoking status: Light Tobacco Smoker    Last attempt to quit: 07/29/2008    Years since quitting: 11.4  . Smokeless tobacco: Never Used  . Tobacco comment: 1 a day  Vaping Use  . Vaping Use: Never used  Substance Use Topics  . Alcohol use:  Not Currently  . Drug use: No    Home Medications Prior to Admission medications   Medication Sig Start Date End Date Taking? Authorizing Provider  ALPRAZolam Prudy Feeler) 0.5 MG tablet Take 0.5 mg by mouth at bedtime as needed for sleep.    [provider]  diphenhydramine-acetaminophen (TYLENOL PM) 25-500 MG TABS tablet Take 1 tablet by mouth at bedtime as needed (sleep). Patient not taking: Reported on 12/13/2019    [provider]  ferrous sulfate (FERROUSUL) 325 (65 FE) MG tablet Take 1 tablet (325 mg total) by mouth 2 (two) times daily. 11/11/19   Constant, Peggy, MD  ferrous sulfate 325 (65 FE) MG tablet Take 1 tablet (325 mg total) by mouth 2 (two) times daily with a meal. 11/11/19   Constant, Peggy, MD  ibuprofen (ADVIL) 600 MG tablet Take 1 tablet (600 mg total) by mouth every 6 (six) hours as needed. Patient not taking: Reported on 12/13/2019 11/11/19   Constant, Peggy, MD  ibuprofen (ADVIL) 600 MG tablet Take 1 tablet (600 mg total) by mouth every 6 (six) hours as needed.  Patient not taking: Reported on 12/13/2019 11/11/19   Constant, Peggy, MD  oxyCODONE-acetaminophen (PERCOCET/ROXICET) 5-325 MG tablet Take 1 tablet by mouth every 4 (four) hours as needed. Patient not taking: Reported on 12/13/2019 11/11/19   Constant, Peggy, MD  oxyCODONE-acetaminophen (PERCOCET/ROXICET) 5-325 MG tablet Take 1 tablet by mouth every 6 (six) hours as needed. Patient not taking: Reported on 11/16/2019 11/11/19   Constant, Peggy, MD  Prenatal Vit-Fe Fumarate-FA (PREPLUS) 27-1 MG TABS Take 1 tablet by mouth daily. 09/23/19   Brock Bad, MD    Allergies    Latex  Review of Systems   Review of Systems  All other systems reviewed and are negative.   Physical Exam Updated Vital Signs BP 128/79 (BP Location: Left Arm)   Pulse 98   Temp 98.3 F (36.8 C) (Oral)   Resp 18   SpO2 99%   Physical Exam Vitals and nursing note reviewed.  Constitutional:      Appearance: She is  well-developed.  HENT:     Head: Normocephalic and atraumatic.     Nose: No congestion or rhinorrhea.     Mouth/Throat:     Mouth: Mucous membranes are moist.  Eyes:     Pupils: Pupils are equal, round, and reactive to light.  Cardiovascular:     Rate and Rhythm: Normal rate and regular rhythm.  Pulmonary:     Effort: No respiratory distress.     Breath sounds: No stridor.  Abdominal:     General: There is no distension.     Tenderness: There is no abdominal tenderness.  Musculoskeletal:        General: No swelling or tenderness. Normal range of motion.     Cervical back: Normal range of motion.  Skin:    General: Skin is warm and dry.  Neurological:     General: No focal deficit present.     Mental Status: She is alert.     ED Results / Procedures / Treatments   Labs (all labs ordered are listed, but only abnormal results are displayed) Labs Reviewed - No data to display  EKG None  Radiology DG Ankle Complete Left  Result Date: 01/11/2020 CLINICAL DATA:  Pain EXAM: LEFT ANKLE COMPLETE - 3+ VIEW COMPARISON:  None. FINDINGS: There is soft tissue swelling about the lateral malleolus. There is no acute displaced fracture. No dislocation. There is a joint effusion. IMPRESSION: 1. No acute displaced fracture or dislocation. 2. Soft tissue swelling about the lateral malleolus with a joint effusion. Electronically Signed   By: Katherine Mantle M.D.   On: 01/11/2020 00:46    Procedures Procedures (including critical care time)  Medications Ordered in ED Medications  ketorolac (TORADOL) injection 30 mg (30 mg Intramuscular Given 01/11/20 0229)    ED Course  I have reviewed the triage vital signs and the nursing notes.  Pertinent labs & imaging results that were available during my care of the patient were reviewed by me and considered in my medical decision making (see chart for details).    MDM Rules/Calculators/A&P                          39 yo F who presents to  ED with L ankle pain and swelling after fall with eversion. On exam has pain with ROM, slight swelling, pain with palpation of malleolus. Difficulty bearing weight for more than a couple steps. So XR done to evaluate for fracture and negative. No proximal  fibular ttp to suggest fracture or unstable injury. No laxity in ankle to suggest severe grade 3 sprain.  Plan for air splint, ace wrap. NSAIDs for pain. Will also employ RICE therapy and weight bearing as tolerated. If symptoms not improving in one week, will plan to follow up with PCP or orthopedics for repeat imaging to evaluate for occult fracture.   Final Clinical Impression(s) / ED Diagnoses Final diagnoses:  Sprain of left ankle, unspecified ligament, initial encounter    Rx / DC Orders ED Discharge Orders    None       Heiley Shaikh, Barbara Cower, MD 01/11/20 [phone]

## 2020-01-11 NOTE — ED Triage Notes (Signed)
Pt states that she twisted her ankle yesterday, some swelling noted, pt ambulatory.

## 2020-04-04 ENCOUNTER — Encounter (HOSPITAL_BASED_OUTPATIENT_CLINIC_OR_DEPARTMENT_OTHER): Payer: Self-pay

## 2020-04-04 ENCOUNTER — Ambulatory Visit (HOSPITAL_BASED_OUTPATIENT_CLINIC_OR_DEPARTMENT_OTHER): Admit: 2020-04-04 | Payer: Medicaid Other | Admitting: Obstetrics and Gynecology

## 2020-04-04 SURGERY — SALPINGECTOMY, BILATERAL, LAPAROSCOPIC
Anesthesia: Choice | Laterality: Bilateral

## 2020-10-19 IMAGING — CR DG ANKLE COMPLETE 3+V*L*
3 series · 3 of 3 positions shown · non-contrast
Comparison: None.

CLINICAL DATA: Pain

EXAM:
LEFT ANKLE COMPLETE - 3+ VIEW

[ankle ap]
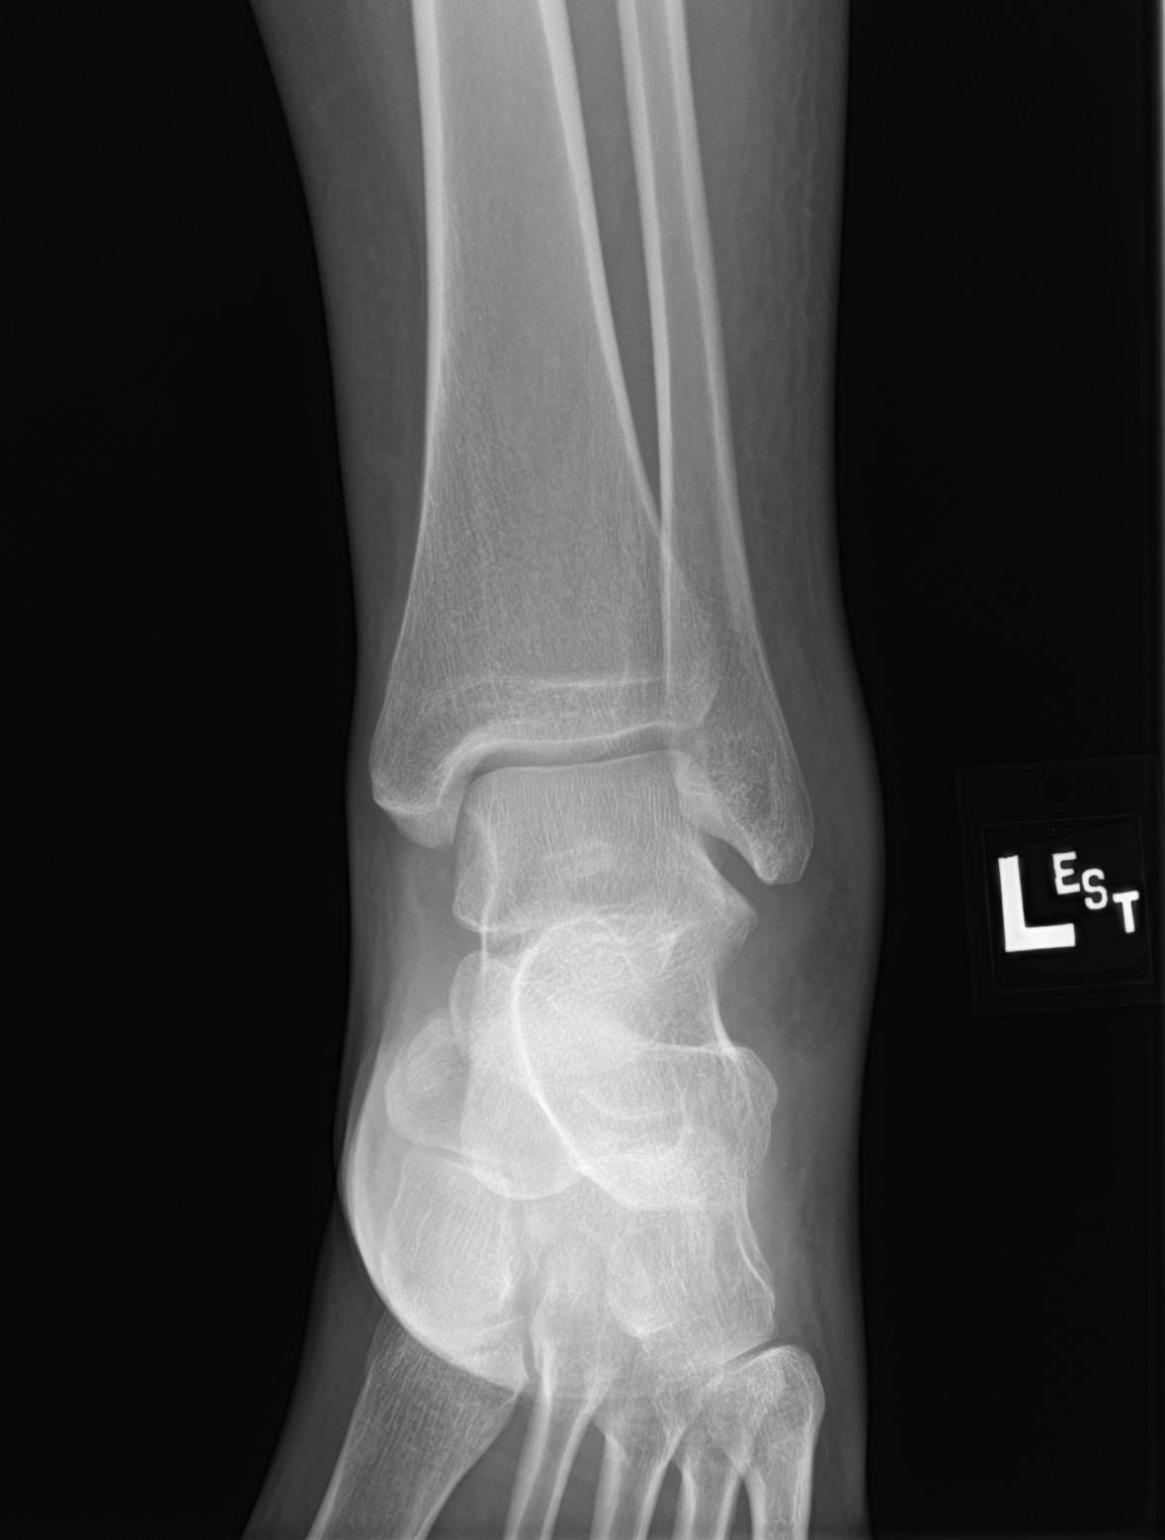

[ankle obl]
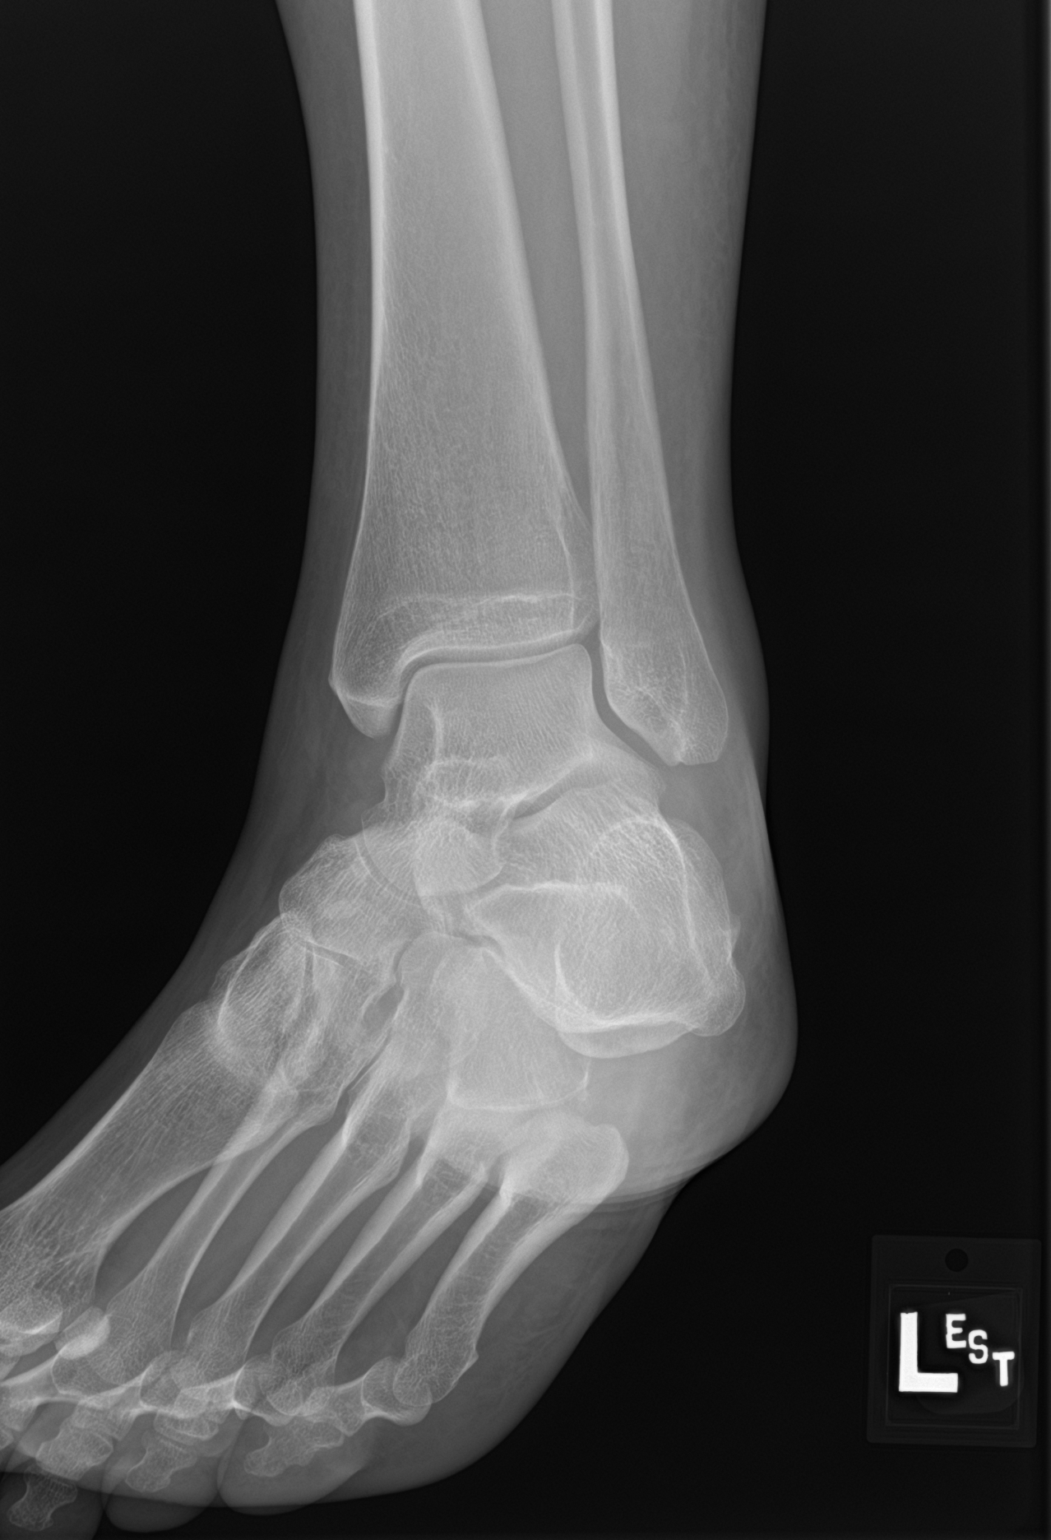

[ankle lat]
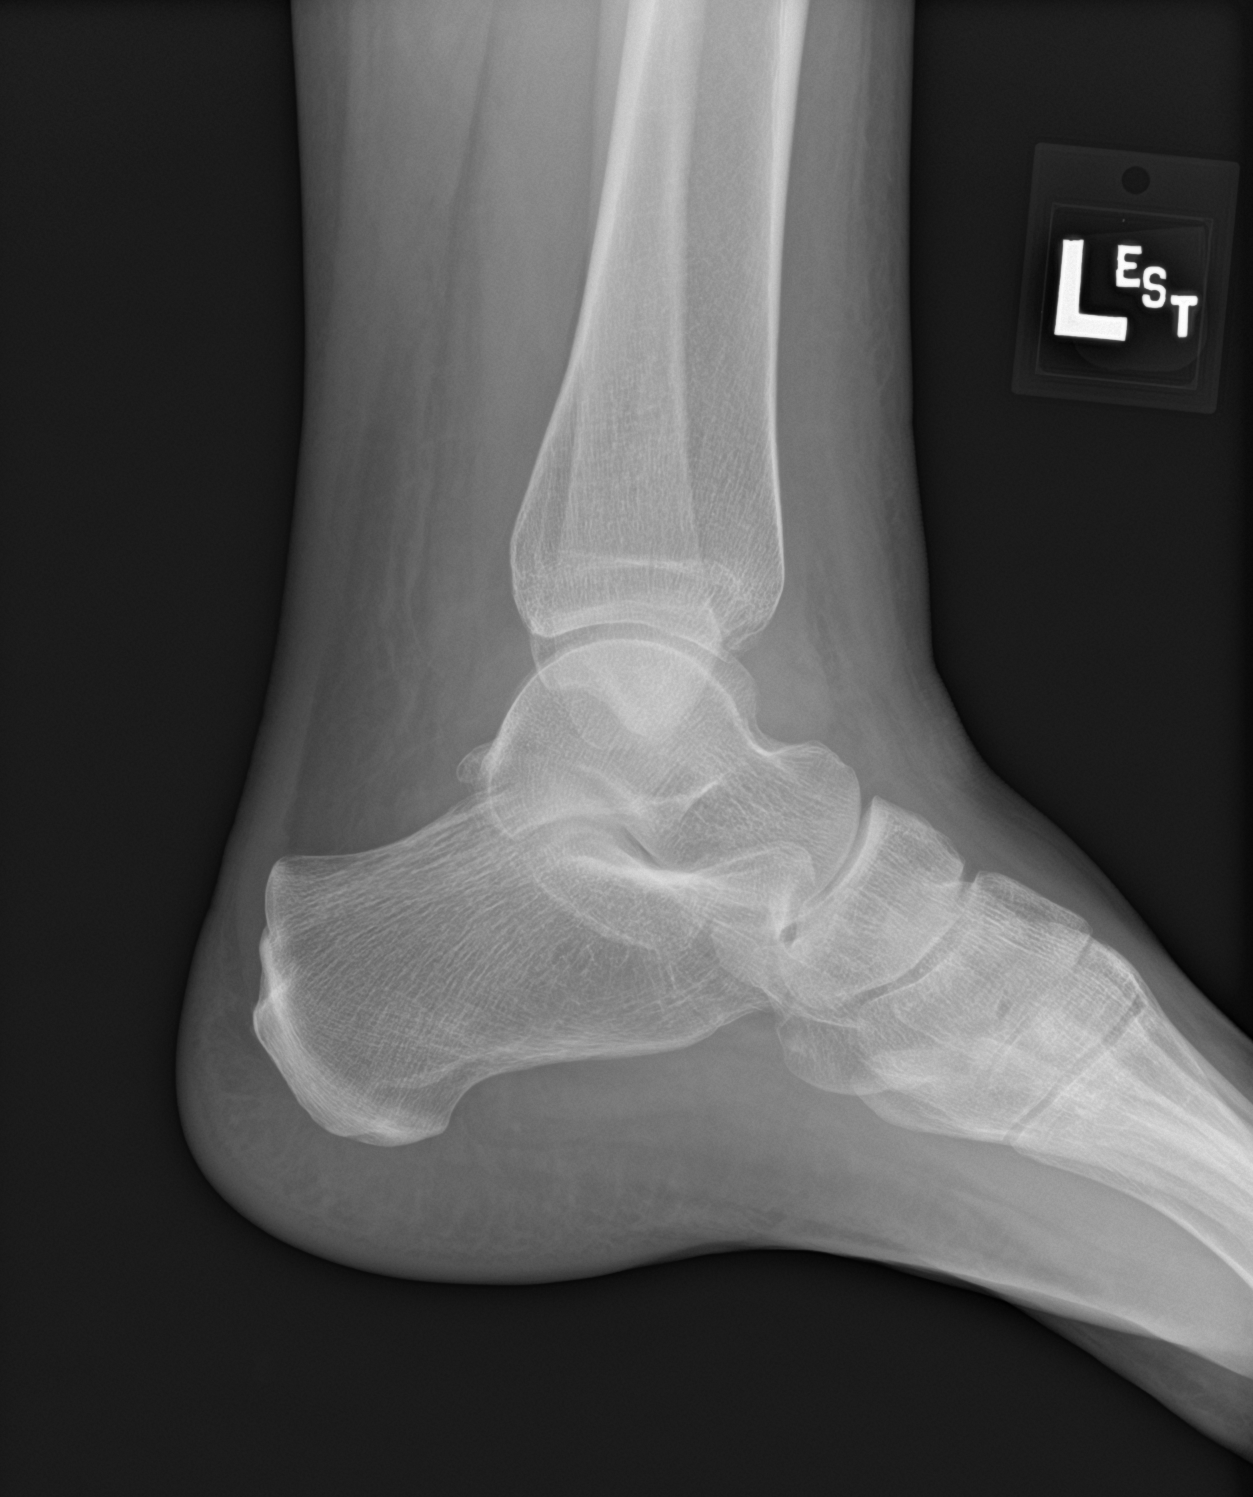

[3 of 3 positions shown; findings below may reference images not displayed]

FINDINGS: There is soft tissue swelling about the lateral malleolus. There is
no acute displaced fracture. No dislocation. There is a joint
effusion.
IMPRESSION: 1. No acute displaced fracture or dislocation.
2. Soft tissue swelling about the lateral malleolus with a joint
effusion.
